# Patient Record
Sex: Male | Born: 1940 | Race: White | Hispanic: No | Marital: Married | Smoking: Former smoker
Health system: Southern US, Community
[De-identification: ages and names within clinical notes are randomized; demographics above are authoritative.]

## PROBLEM LIST (undated history)

## (undated) DIAGNOSIS — I350 Nonrheumatic aortic (valve) stenosis: Secondary | ICD-10-CM

## (undated) DIAGNOSIS — E785 Hyperlipidemia, unspecified: Secondary | ICD-10-CM

## (undated) DIAGNOSIS — I8392 Asymptomatic varicose veins of left lower extremity: Secondary | ICD-10-CM

## (undated) DIAGNOSIS — R011 Cardiac murmur, unspecified: Secondary | ICD-10-CM

## (undated) DIAGNOSIS — H9319 Tinnitus, unspecified ear: Secondary | ICD-10-CM

## (undated) DIAGNOSIS — K649 Unspecified hemorrhoids: Secondary | ICD-10-CM

## (undated) DIAGNOSIS — R7983 Abnormal findings of blood amino-acid level: Secondary | ICD-10-CM

## (undated) DIAGNOSIS — D126 Benign neoplasm of colon, unspecified: Secondary | ICD-10-CM

## (undated) DIAGNOSIS — J309 Allergic rhinitis, unspecified: Secondary | ICD-10-CM

## (undated) DIAGNOSIS — L509 Urticaria, unspecified: Secondary | ICD-10-CM

## (undated) DIAGNOSIS — M353 Polymyalgia rheumatica: Secondary | ICD-10-CM

## (undated) DIAGNOSIS — L570 Actinic keratosis: Secondary | ICD-10-CM

## (undated) DIAGNOSIS — H9190 Unspecified hearing loss, unspecified ear: Secondary | ICD-10-CM

## (undated) DIAGNOSIS — E7211 Homocystinuria: Secondary | ICD-10-CM

## (undated) DIAGNOSIS — K219 Gastro-esophageal reflux disease without esophagitis: Secondary | ICD-10-CM

## (undated) DIAGNOSIS — G2581 Restless legs syndrome: Secondary | ICD-10-CM

## (undated) DIAGNOSIS — I2510 Atherosclerotic heart disease of native coronary artery without angina pectoris: Secondary | ICD-10-CM

## (undated) HISTORY — PX: CARDIAC CATHETERIZATION: SHX172

## (undated) HISTORY — DX: Hyperlipidemia, unspecified: E78.5

## (undated) HISTORY — DX: Atherosclerotic heart disease of native coronary artery without angina pectoris: I25.10

---
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
## 1973-01-26 HISTORY — PX: VASECTOMY: SHX75

## 1997-06-27 ENCOUNTER — Ambulatory Visit (HOSPITAL_COMMUNITY): Admission: RE | Admit: 1997-06-27 | Discharge: 1997-06-27 | Payer: Self-pay | Admitting: Urology

## 1997-10-29 ENCOUNTER — Ambulatory Visit (HOSPITAL_COMMUNITY): Admission: RE | Admit: 1997-10-29 | Discharge: 1997-10-29 | Payer: Self-pay | Admitting: *Deleted

## 2001-06-10 ENCOUNTER — Emergency Department (HOSPITAL_COMMUNITY): Admission: EM | Admit: 2001-06-10 | Discharge: 2001-06-11 | Payer: Self-pay | Admitting: *Deleted

## 2001-10-10 ENCOUNTER — Encounter (INDEPENDENT_AMBULATORY_CARE_PROVIDER_SITE_OTHER): Payer: Self-pay

## 2001-10-10 ENCOUNTER — Ambulatory Visit (HOSPITAL_COMMUNITY): Admission: RE | Admit: 2001-10-10 | Discharge: 2001-10-10 | Payer: Self-pay | Admitting: Internal Medicine

## 2002-11-29 ENCOUNTER — Ambulatory Visit (HOSPITAL_COMMUNITY): Admission: RE | Admit: 2002-11-29 | Discharge: 2002-11-29 | Payer: Self-pay | Admitting: *Deleted

## 2003-06-06 ENCOUNTER — Emergency Department (HOSPITAL_COMMUNITY): Admission: EM | Admit: 2003-06-06 | Discharge: 2003-06-07 | Payer: Self-pay | Admitting: Emergency Medicine

## 2008-06-19 ENCOUNTER — Ambulatory Visit (HOSPITAL_COMMUNITY): Admission: RE | Admit: 2008-06-19 | Discharge: 2008-06-19 | Payer: Self-pay | Admitting: *Deleted

## 2009-01-26 HISTORY — PX: NM MYOCAR PERF WALL MOTION: HXRAD629

## 2009-01-26 HISTORY — PX: TRANSTHORACIC ECHOCARDIOGRAM: SHX275

## 2010-06-10 NOTE — Op Note (Signed)
NAME:  Evan Myers, WELTER NO.:  [id number]   MEDICAL RECORD NO.:  [id number]          PATIENT TYPE:  AMB   LOCATION:  ENDO                         FACILITY:  [facility]   PHYSICIAN:  Georgiana Spinner, M.D.    DATE OF BIRTH:  1940/02/15   DATE OF PROCEDURE:  06/19/2008  DATE OF DISCHARGE:                               OPERATIVE REPORT   PROCEDURE:  Colonoscopy.   INDICATIONS:  Colon polyps.   ANESTHESIA:  Fentanyl 62 mcg, Versed 5 mg.   PROCEDURE:  With the patient mildly sedated in the left lateral  decubitus position, rectal exam was performed which was unremarkable to  my exam.  Subsequently the Pentax videoscopic colonoscope was inserted  into the rectum and passed under direct vision to the cecum identified  by ileocecal valve and appendiceal orifice, both of which were  photographed.  From this point the colonoscope was slowly withdrawn  taking circumferential views of colonic mucosa, stopping only in the  rectum which appeared normal on direct, showed hemorrhoids on  retroflexed view.  The endoscope was straightened and withdrawn.  The  patient's vital signs and pulse oximeter remained stable.  The patient  tolerated the procedure well without apparent complications.   FINDINGS:  Occasional diverticulum of the sigmoid colon.  Internal  hemorrhoids; otherwise unremarkable exam.   PLAN:  Consider repeat examination in 5 years.           ______________________________  Georgiana Spinner, M.D.     GMO/MEDQ  D:  06/19/2008  T:  06/19/2008  Job:  [id number]

## 2010-06-13 NOTE — Op Note (Signed)
   NAME:  Evan Myers, Evan Myers                   ACCOUNT NO.:  [id number]   MEDICAL RECORD NO.:  [id number]                   PATIENT TYPE:  AMB   LOCATION:  ENDO                                 FACILITY:  [facility]   PHYSICIAN:  Georgiana Spinner, M.D.                 DATE OF BIRTH:  October 06, 1940   DATE OF PROCEDURE:  10/10/2001  DATE OF DISCHARGE:                                 OPERATIVE REPORT   PROCEDURE:  Endoscopy.   INDICATIONS:  Gastroesophageal reflux disease, question of Barrett's.   ANESTHESIA:  Demerol 50, Versed 4 mg.   DESCRIPTION OF PROCEDURE:  With patient mildly sedated in the left lateral  decubitus position, the Olympus videoscopic endoscope was inserted in the  mouth and passed under direct vision through the esophagus, which appeared  normal except for the distal esophagus, and there was a question of an area  of Barrett's, photographed and biopsied.  We entered into the stomach.  Fundus, body, antrum, duodenal bulb, and second portion of duodenum appeared  normal.  From this point, the endoscope was slowly withdrawn, taking  circumferential views of the entire duodenal mucosa until the then endoscope  pulled back into the stomach, placed in retroflexion to view the stomach  from below.  The endoscope was then straightened and withdrawn, taking  circumferential views of the remaining gastric and esophageal mucosa.  The  patient's vital signs and pulse oximeter remained stable.  The patient  tolerated the procedure well without apparent complications.   FINDINGS:  Question of Barrett's esophagus, biopsied.  Await biopsy report.  The patient will call me for results and follow up with me as an outpatient.                                               Georgiana Spinner, M.D.    GMO/MEDQ  D:  10/10/2001  T:  10/10/2001  Job:  [id number]

## 2010-06-13 NOTE — Op Note (Signed)
   NAME:  Evan Myers, Evan Myers                   ACCOUNT NO.:  [id number]   MEDICAL RECORD NO.:  [id number]                   PATIENT TYPE:  AMB   LOCATION:  ENDO                                 FACILITY:  [facility]   PHYSICIAN:  Georgiana Spinner, M.D.                 DATE OF BIRTH:  03-26-1940   DATE OF PROCEDURE:  11/29/2002  DATE OF DISCHARGE:                                 OPERATIVE REPORT   PROCEDURE:  Colonoscopy.   INDICATIONS:  Rectal bleeding.   ANESTHESIA:  Demerol 70 mg, Versed 7 mg.   DESCRIPTION OF PROCEDURE:  With the patient mildly sedated in the left  lateral decubitus position, the Olympus videoscopic colonoscope was inserted  in the rectum and passed under direct vision to the cecum, identified by the  crow's foot of the cecum, the ileocecal valve and appendiceal orifice, all  of which were photographed.  From this point the colonoscope was slowly  withdrawn, taking circumferential views of the colonic mucosa, stopping only  in the rectum, which appeared normal on direct and showed hemorrhoids on  retroflexed view.  The endoscope was straightened and withdrawn.  The  patient's vital signs and pulse oximetry remained stable.  The patient  tolerated the procedure well without apparent complications.   FINDINGS:  Internal hemorrhoids, otherwise unremarkable colonoscopic  examination to the cecum.   PLAN:  Repeat examination in approximately five years.                                               Georgiana Spinner, M.D.    GMO/MEDQ  D:  11/29/2002  T:  11/29/2002  Job:  [id number]

## 2013-09-08 ENCOUNTER — Encounter: Payer: Self-pay | Admitting: *Deleted

## 2013-09-14 ENCOUNTER — Ambulatory Visit (INDEPENDENT_AMBULATORY_CARE_PROVIDER_SITE_OTHER): Payer: Medicare Other | Admitting: Cardiovascular Disease

## 2013-09-14 VITALS — BP 104/64 | HR 60 | Resp 16 | Ht 70.0 in | Wt 206.7 lb

## 2013-09-14 DIAGNOSIS — I359 Nonrheumatic aortic valve disorder, unspecified: Secondary | ICD-10-CM

## 2013-09-14 DIAGNOSIS — I350 Nonrheumatic aortic (valve) stenosis: Secondary | ICD-10-CM

## 2013-09-14 DIAGNOSIS — E785 Hyperlipidemia, unspecified: Secondary | ICD-10-CM

## 2013-09-14 DIAGNOSIS — R011 Cardiac murmur, unspecified: Secondary | ICD-10-CM

## 2013-09-14 NOTE — Patient Instructions (Signed)
Your physician has requested that you have an echocardiogram. Echocardiography is a painless test that uses sound waves to create images of your heart. It provides your doctor with information about the size and shape of your heart and how well your heart's chambers and valves are working. This procedure takes approximately one hour. There are no restrictions for this procedure.  Dr. Croitoru recommends that you schedule a follow-up appointment in: One year.   

## 2013-09-16 ENCOUNTER — Encounter: Payer: Self-pay | Admitting: Cardiovascular Disease

## 2013-09-16 DIAGNOSIS — I350 Nonrheumatic aortic (valve) stenosis: Secondary | ICD-10-CM | POA: Insufficient documentation

## 2013-09-16 DIAGNOSIS — R011 Cardiac murmur, unspecified: Secondary | ICD-10-CM | POA: Insufficient documentation

## 2013-09-16 DIAGNOSIS — E785 Hyperlipidemia, unspecified: Secondary | ICD-10-CM | POA: Insufficient documentation

## 2013-09-16 NOTE — Assessment & Plan Note (Signed)
His physical exam is compatible with aortic stenosis. The findings are consistent with more than mild valvular stenosis, but his symptoms to suggest that he does not have severe or critical aortic stenosis. It is reasonable to reevaluate the aortic valve since it has been normal as 5 years since his last echo. He should remain physically active and report emergence of exertional symptoms.

## 2013-09-16 NOTE — Progress Notes (Signed)
Patient ID: Evan Myers, male   DOB: Jun 10, 1940, 73 y.o.   MRN: [id number]      Reason for office visit Cardiac murmur  Mr. Frisk is a 73 year old gentleman with history of hyperlipidemia referred for a cardiac murmur. He has been diagnosed with mild obstructive sleep apnea does require CPAP therapy and has gastroesophageal reflux disease and a previous history of polymyalgia rheumatica. He is very active and looks substantially younger than his chronological age. He denies cardiovascular complaints and specifically denies exertional angina, dyspnea or syncope. He walks briskly 40 minutes a day 5 days a week.  His physical exam reveals an early peaking systolic ejection murmur heard best in the aortic focus and at the left lower sternal border that does not really radiate towards the carotids or the apex. He had an echocardiogram performed in April 2011 that showed normal left ventricular size systolic and diastolic function and mild valvular aortic stenosis (calculated aortic valve area was not reported, peak gradient 23 mm Hg, mean gradient 14 mm Hg). His electrocardiogram shows borderline criteria for LVH by voltage only without any repolarization abnormalities.   No Known Allergies  Current Outpatient Prescriptions  Medication Sig Dispense Refill  . alfuzosin (UROXATRAL) 10 MG 24 hr tablet Take 10 mg by mouth daily.       Marland Kitchen aspirin 81 MG tablet Take 81 mg by mouth daily.      Marland Kitchen atorvastatin (LIPITOR) 10 MG tablet Take 5 mg by mouth daily.      . cetirizine (ZYRTEC) 10 MG tablet Take 10 mg by mouth daily.      . fluticasone (FLONASE) 50 MCG/ACT nasal spray Place into both nostrils daily as needed for allergies or rhinitis.      . Multiple Vitamins-Minerals (CENTRUM SILVER PO) Take by mouth daily.      Marland Kitchen omeprazole (PRILOSEC) 40 MG capsule Take 40 mg by mouth daily.       No current facility-administered medications for this visit.    Past Medical History  Diagnosis Date  .  Hyperlipidemia   . Mild CAD     via cath in 2008 (per office notes)    Past Surgical History  Procedure Laterality Date  . Tonsillectomy  1949  . Vasectomy  1975  . Transthoracic echocardiogram  2011    EF=>55%; mild mitral annular calcification, mild MR; mild TR, normal RVSP; mild calcification of AV leaflets and mild valvular AS; aortic root sclerosis/calcification  . Nm myocar perf wall motion  2011    bruce myoview - normal perfusion, EF 58%    Family History  Problem Relation Age of Onset  . Hypertension Mother   . Hyperlipidemia Mother   . Heart failure Mother   . Skin cancer Mother   . Heart attack Father 11  . Hypertension Father   . COPD Father   . Emphysema Father   . Stroke Father   . Hypotension Maternal Grandmother   . Diabetes Paternal Grandmother   . Nephrolithiasis Brother     half - sibling  . Hypertension Brother     half - sibling  . Hyperlipidemia Brother     half - sibling  . Hypertension Sister   . SIDS Brother     9 months, half - sibling    History   Social History  . Marital Status: Married    Spouse Name: N/A    Number of Children: 2  . Years of Education: bachelor's   Occupational History  .  electronics/engineering    Social History Main Topics  . Smoking status: Former Smoker -- 1.00 packs/day for 6 years    Types: Cigarettes    Quit date: 09/08/1968  . Smokeless tobacco: Not on file  . Alcohol Use: Not on file  . Drug Use: Not on file  . Sexual Activity: Not on file   Other Topics Concern  . Not on file   Social History Narrative  . No narrative on file    Review of systems: The patient specifically denies any chest pain at rest or with exertion, dyspnea at rest or with exertion, orthopnea, paroxysmal nocturnal dyspnea, syncope, palpitations, focal neurological deficits, intermittent claudication, lower extremity edema, unexplained weight gain, cough, hemoptysis or wheezing.  The patient also denies abdominal pain,  nausea, vomiting, dysphagia, diarrhea, constipation, polyuria, polydipsia, dysuria, hematuria, frequency, urgency, abnormal bleeding or bruising, fever, chills, unexpected weight changes, mood swings, change in skin or hair texture, change in voice quality, auditory or visual problems, allergic reactions or rashes, new musculoskeletal complaints other than usual "aches and pains".   PHYSICAL EXAM BP 104/64  Pulse 60  Resp 16  Ht 5\' 10"  (1.778 m)  Wt 206 lb 11.2 oz (93.759 kg)  BMI 29.66 kg/m2  General: Alert, oriented x3, no distress Head: no evidence of trauma, PERRL, EOMI, no exophtalmos or lid lag, no myxedema, no xanthelasma; normal ears, nose and oropharynx Neck: normal jugular venous pulsations and no hepatojugular reflux; brisk carotid pulses without delay and no carotid bruits Chest: clear to auscultation, no signs of consolidation by percussion or palpation, normal fremitus, symmetrical and full respiratory excursions Cardiovascular: normal position and quality of the apical impulse, regular rhythm, normal first and second heart sounds, grade 2-3/6 early to mid peaking systolic ejection murmur heard in the aortic focus and left lower sternal border, no diastolic murmurs, rubs or gallops Abdomen: no tenderness or distention, no masses by palpation, no abnormal pulsatility or arterial bruits, normal bowel sounds, no hepatosplenomegaly Extremities: no clubbing, cyanosis or edema; 2+ radial, ulnar and brachial pulses bilaterally; 2+ right femoral, posterior tibial and dorsalis pedis pulses; 2+ left femoral, posterior tibial and dorsalis pedis pulses; no subclavian or femoral bruits Neurological: grossly nonfocal   EKG: Sinus rhythm, voltage criteria for LVH otherwise normal  Lipid Panel  Total cholesterol 154, triglycerides 46, HDL 47, LDL 98   ASSESSMENT AND PLAN Aortic valve stenosis His physical exam is compatible with aortic stenosis. The findings are consistent with more than  mild valvular stenosis, but his symptoms suggest that he does not have severe or critical aortic stenosis. It is reasonable to reevaluate the aortic valve by echo, since it has been almost 5 years since his last echo. He should remain physically active and report emergence of exertional symptoms.  Hyperlipidemia Parameters are in the desirable range   Orders Placed This Encounter  Procedures  . EKG 12-Lead  . 2D Echocardiogram without contrast   Meds ordered this encounter  Medications  . atorvastatin (LIPITOR) 10 MG tablet    Sig: Take 5 mg by mouth daily.  Marland Kitchen omeprazole (PRILOSEC) 40 MG capsule    Sig: Take 40 mg by mouth daily.    Holli Humbles, MD, [place] [phone] office [phone] pager

## 2013-09-16 NOTE — Assessment & Plan Note (Signed)
Parameters are in the desirable range

## 2013-09-20 ENCOUNTER — Ambulatory Visit (HOSPITAL_COMMUNITY)
Admission: RE | Admit: 2013-09-20 | Discharge: 2013-09-20 | Disposition: A | Discharge status: Home / Self Care | Payer: Medicare Other | Source: Ambulatory Visit | Attending: Cardiovascular Disease | Admitting: Cardiovascular Disease

## 2013-09-20 DIAGNOSIS — R011 Cardiac murmur, unspecified: Secondary | ICD-10-CM | POA: Diagnosis not present

## 2013-09-20 DIAGNOSIS — I359 Nonrheumatic aortic valve disorder, unspecified: Secondary | ICD-10-CM | POA: Diagnosis present

## 2013-09-20 DIAGNOSIS — G4730 Sleep apnea, unspecified: Secondary | ICD-10-CM | POA: Insufficient documentation

## 2013-09-20 DIAGNOSIS — I350 Nonrheumatic aortic (valve) stenosis: Secondary | ICD-10-CM

## 2013-09-20 DIAGNOSIS — I059 Rheumatic mitral valve disease, unspecified: Secondary | ICD-10-CM

## 2013-09-20 DIAGNOSIS — E785 Hyperlipidemia, unspecified: Secondary | ICD-10-CM | POA: Insufficient documentation

## 2013-09-20 NOTE — Progress Notes (Signed)
2D Echo Performed 09/20/2013    Markiesha Delia, RCS  

## 2014-05-07 DIAGNOSIS — H2513 Age-related nuclear cataract, bilateral: Secondary | ICD-10-CM | POA: Diagnosis not present

## 2014-05-07 DIAGNOSIS — H40003 Preglaucoma, unspecified, bilateral: Secondary | ICD-10-CM | POA: Diagnosis not present

## 2014-05-15 DIAGNOSIS — Z7952 Long term (current) use of systemic steroids: Secondary | ICD-10-CM | POA: Diagnosis not present

## 2014-05-15 DIAGNOSIS — M353 Polymyalgia rheumatica: Secondary | ICD-10-CM | POA: Diagnosis not present

## 2014-08-17 DIAGNOSIS — E780 Pure hypercholesterolemia: Secondary | ICD-10-CM | POA: Diagnosis not present

## 2014-08-23 DIAGNOSIS — Z0000 Encounter for general adult medical examination without abnormal findings: Secondary | ICD-10-CM | POA: Diagnosis not present

## 2014-08-23 DIAGNOSIS — Z1212 Encounter for screening for malignant neoplasm of rectum: Secondary | ICD-10-CM | POA: Diagnosis not present

## 2014-08-23 DIAGNOSIS — B079 Viral wart, unspecified: Secondary | ICD-10-CM | POA: Diagnosis not present

## 2014-08-23 DIAGNOSIS — M791 Myalgia: Secondary | ICD-10-CM | POA: Diagnosis not present

## 2014-08-23 DIAGNOSIS — Z8739 Personal history of other diseases of the musculoskeletal system and connective tissue: Secondary | ICD-10-CM | POA: Diagnosis not present

## 2014-08-27 DIAGNOSIS — M791 Myalgia: Secondary | ICD-10-CM | POA: Diagnosis not present

## 2014-10-10 DIAGNOSIS — E039 Hypothyroidism, unspecified: Secondary | ICD-10-CM | POA: Diagnosis not present

## 2014-10-11 DIAGNOSIS — E039 Hypothyroidism, unspecified: Secondary | ICD-10-CM | POA: Diagnosis not present

## 2014-10-11 DIAGNOSIS — M791 Myalgia: Secondary | ICD-10-CM | POA: Diagnosis not present

## 2014-10-11 DIAGNOSIS — Z23 Encounter for immunization: Secondary | ICD-10-CM | POA: Diagnosis not present

## 2015-04-29 DIAGNOSIS — H01003 Unspecified blepharitis right eye, unspecified eyelid: Secondary | ICD-10-CM | POA: Diagnosis not present

## 2015-04-29 DIAGNOSIS — H2513 Age-related nuclear cataract, bilateral: Secondary | ICD-10-CM | POA: Diagnosis not present

## 2015-04-29 DIAGNOSIS — H25013 Cortical age-related cataract, bilateral: Secondary | ICD-10-CM | POA: Diagnosis not present

## 2015-04-29 DIAGNOSIS — H35033 Hypertensive retinopathy, bilateral: Secondary | ICD-10-CM | POA: Diagnosis not present

## 2015-05-06 DIAGNOSIS — H2513 Age-related nuclear cataract, bilateral: Secondary | ICD-10-CM | POA: Diagnosis not present

## 2015-05-06 DIAGNOSIS — H40003 Preglaucoma, unspecified, bilateral: Secondary | ICD-10-CM | POA: Diagnosis not present

## 2015-05-06 DIAGNOSIS — M353 Polymyalgia rheumatica: Secondary | ICD-10-CM | POA: Diagnosis not present

## 2015-08-23 DIAGNOSIS — E039 Hypothyroidism, unspecified: Secondary | ICD-10-CM | POA: Diagnosis not present

## 2015-08-23 DIAGNOSIS — E7800 Pure hypercholesterolemia, unspecified: Secondary | ICD-10-CM | POA: Diagnosis not present

## 2015-08-23 DIAGNOSIS — Z125 Encounter for screening for malignant neoplasm of prostate: Secondary | ICD-10-CM | POA: Diagnosis not present

## 2015-08-23 DIAGNOSIS — Z0000 Encounter for general adult medical examination without abnormal findings: Secondary | ICD-10-CM | POA: Diagnosis not present

## 2015-08-23 DIAGNOSIS — Z0001 Encounter for general adult medical examination with abnormal findings: Secondary | ICD-10-CM | POA: Diagnosis not present

## 2015-08-30 DIAGNOSIS — Z0000 Encounter for general adult medical examination without abnormal findings: Secondary | ICD-10-CM | POA: Diagnosis not present

## 2015-08-30 DIAGNOSIS — E039 Hypothyroidism, unspecified: Secondary | ICD-10-CM | POA: Diagnosis not present

## 2015-08-30 DIAGNOSIS — E7800 Pure hypercholesterolemia, unspecified: Secondary | ICD-10-CM | POA: Diagnosis not present

## 2015-11-27 DIAGNOSIS — E7800 Pure hypercholesterolemia, unspecified: Secondary | ICD-10-CM | POA: Diagnosis not present

## 2015-12-02 DIAGNOSIS — K219 Gastro-esophageal reflux disease without esophagitis: Secondary | ICD-10-CM | POA: Diagnosis not present

## 2015-12-02 DIAGNOSIS — M25569 Pain in unspecified knee: Secondary | ICD-10-CM | POA: Diagnosis not present

## 2015-12-02 DIAGNOSIS — E7800 Pure hypercholesterolemia, unspecified: Secondary | ICD-10-CM | POA: Diagnosis not present

## 2015-12-02 DIAGNOSIS — Z23 Encounter for immunization: Secondary | ICD-10-CM | POA: Diagnosis not present

## 2015-12-28 ENCOUNTER — Encounter (HOSPITAL_COMMUNITY): Payer: Self-pay | Admitting: Oncology

## 2015-12-28 ENCOUNTER — Emergency Department (HOSPITAL_COMMUNITY)
Admission: EM | Admit: 2015-12-28 | Discharge: 2015-12-29 | Disposition: A | Discharge status: Home / Self Care | Payer: Medicare Other | Attending: Emergency Medicine | Admitting: Emergency Medicine

## 2015-12-28 ENCOUNTER — Emergency Department (HOSPITAL_COMMUNITY): Payer: Medicare Other

## 2015-12-28 DIAGNOSIS — Z87891 Personal history of nicotine dependence: Secondary | ICD-10-CM | POA: Diagnosis not present

## 2015-12-28 DIAGNOSIS — R590 Localized enlarged lymph nodes: Secondary | ICD-10-CM | POA: Diagnosis not present

## 2015-12-28 DIAGNOSIS — Z7982 Long term (current) use of aspirin: Secondary | ICD-10-CM | POA: Diagnosis not present

## 2015-12-28 DIAGNOSIS — Z79899 Other long term (current) drug therapy: Secondary | ICD-10-CM | POA: Diagnosis not present

## 2015-12-28 DIAGNOSIS — J029 Acute pharyngitis, unspecified: Secondary | ICD-10-CM | POA: Diagnosis not present

## 2015-12-28 DIAGNOSIS — I2510 Atherosclerotic heart disease of native coronary artery without angina pectoris: Secondary | ICD-10-CM | POA: Diagnosis not present

## 2015-12-28 DIAGNOSIS — R509 Fever, unspecified: Secondary | ICD-10-CM | POA: Diagnosis present

## 2015-12-28 HISTORY — DX: Asymptomatic varicose veins of left lower extremity: I83.92

## 2015-12-28 HISTORY — DX: Nonrheumatic aortic (valve) stenosis: I35.0

## 2015-12-28 HISTORY — DX: Abnormal findings of blood amino-acid level: R79.83

## 2015-12-28 HISTORY — DX: Polymyalgia rheumatica: M35.3

## 2015-12-28 HISTORY — DX: Tinnitus, unspecified ear: H93.19

## 2015-12-28 HISTORY — DX: Homocystinuria: E72.11

## 2015-12-28 HISTORY — DX: Actinic keratosis: L57.0

## 2015-12-28 HISTORY — DX: Gastro-esophageal reflux disease without esophagitis: K21.9

## 2015-12-28 HISTORY — DX: Benign neoplasm of colon, unspecified: D12.6

## 2015-12-28 HISTORY — DX: Unspecified hearing loss, unspecified ear: H91.90

## 2015-12-28 HISTORY — DX: Restless legs syndrome: G25.81

## 2015-12-28 HISTORY — DX: Allergic rhinitis, unspecified: J30.9

## 2015-12-28 HISTORY — DX: Urticaria, unspecified: L50.9

## 2015-12-28 HISTORY — DX: Cardiac murmur, unspecified: R01.1

## 2015-12-28 HISTORY — DX: Unspecified hemorrhoids: K64.9

## 2015-12-28 LAB — CBC WITH DIFFERENTIAL/PLATELET
BASOS PCT: 0 %
Basophils Absolute: 0 10*3/uL (ref 0.0–0.1)
Eosinophils Absolute: 0.2 10*3/uL (ref 0.0–0.7)
Eosinophils Relative: 2 %
HEMATOCRIT: 38.8 % — AB (ref 39.0–52.0)
HEMOGLOBIN: 13.8 g/dL (ref 13.0–17.0)
LYMPHS ABS: 1.3 10*3/uL (ref 0.7–4.0)
Lymphocytes Relative: 13 %
MCH: 31.6 pg (ref 26.0–34.0)
MCHC: 35.6 g/dL (ref 30.0–36.0)
MCV: 88.8 fL (ref 78.0–100.0)
MONOS PCT: 9 %
Monocytes Absolute: 0.9 10*3/uL (ref 0.1–1.0)
NEUTROS ABS: 7.7 10*3/uL (ref 1.7–7.7)
NEUTROS PCT: 76 %
Platelets: 226 10*3/uL (ref 150–400)
RBC: 4.37 MIL/uL (ref 4.22–5.81)
RDW: 13.4 % (ref 11.5–15.5)
WBC: 10.1 10*3/uL (ref 4.0–10.5)

## 2015-12-28 MED ORDER — MORPHINE SULFATE (PF) 4 MG/ML IV SOLN
4.0000 mg | Freq: Once | INTRAVENOUS | Status: AC
Start: 2015-12-28 — End: 2015-12-28
  Administered 2015-12-28: 4 mg via INTRAVENOUS
  Filled 2015-12-28: qty 1

## 2015-12-28 MED ORDER — SODIUM CHLORIDE 0.9 % IV BOLUS (SEPSIS)
1000.0000 mL | Freq: Once | INTRAVENOUS | Status: AC
  Administered 2015-12-28: 1000 mL via INTRAVENOUS

## 2015-12-28 NOTE — ED Provider Notes (Signed)
[facility] DEPT Provider Note   CSN: [id number] Arrival date & time: 12/28/15  2248  By signing my name below, I, Arianna Nassar, attest that this documentation has been prepared under the direction and in the presence of Jola Schmidt, MD.  Electronically Signed: Julien Nordmann, ED Scribe. 12/28/15. 11:23 PM.    History   Chief Complaint Chief Complaint  Patient presents with  . Flu Like Sx    The history is provided by the patient. No language interpreter was used.   HPI Comments: Evan Myers is a 75 y.o. male who has a PMhx of murmur, HLD and aortic valve stenosis presents to the Emergency Department complaining of gradual worsening, moderate flu like symptoms x 1 week. He reports associcated post nasal drip, mild cough, loss of appetite, nasal congestion, fever (TMax 100.4) fatigue and generalized myalgias. Pt expresses that his sore throat began as bilateral but gradually radiated to the left side of his neck. He further states that around 2:30 this afternoon, he noticed his left lymph node was swollen and tender to touch. He says his sore throat is worsened when opening his mouth. Pt received his flu shot this season. He denies shortness of breath or ear pain.   PCP: Horatio Pel, MD  Past Medical History:  Diagnosis Date  . Hyperlipidemia   . Mild CAD    via cath in 2008 (per office notes)    Patient Active Problem List   Diagnosis Date Noted  . Cardiac murmur 09/16/2013  . Aortic valve stenosis 09/16/2013  . Hyperlipidemia 09/16/2013    Past Surgical History:  Procedure Laterality Date  . NM MYOCAR PERF WALL MOTION  2011   bruce myoview - normal perfusion, EF 58%  . TONSILLECTOMY  1949  . TRANSTHORACIC ECHOCARDIOGRAM  2011   EF=>55%; mild mitral annular calcification, mild MR; mild TR, normal RVSP; mild calcification of AV leaflets and mild valvular AS; aortic root sclerosis/calcification  . VASECTOMY  1975       Home Medications    Prior  to Admission medications   Medication Sig Start Date End Date Taking? Authorizing Provider  alfuzosin (UROXATRAL) 10 MG 24 hr tablet Take 10 mg by mouth daily.     Historical Provider, MD  aspirin 81 MG tablet Take 81 mg by mouth daily.    Historical Provider, MD  atorvastatin (LIPITOR) 10 MG tablet Take 5 mg by mouth daily.    Historical Provider, MD  cetirizine (ZYRTEC) 10 MG tablet Take 10 mg by mouth daily.    Historical Provider, MD  fluticasone (FLONASE) 50 MCG/ACT nasal spray Place into both nostrils daily as needed for allergies or rhinitis.    Historical Provider, MD  Multiple Vitamins-Minerals (CENTRUM SILVER PO) Take by mouth daily.    Historical Provider, MD  omeprazole (PRILOSEC) 40 MG capsule Take 40 mg by mouth daily.    Historical Provider, MD    Family History Family History  Problem Relation Age of Onset  . Hypertension Mother   . Hyperlipidemia Mother   . Heart failure Mother   . Skin cancer Mother   . Heart attack Father 88  . Hypertension Father   . COPD Father   . Emphysema Father   . Stroke Father   . Hypotension Maternal Grandmother   . Diabetes Paternal Grandmother   . Nephrolithiasis Brother     half - sibling  . Hypertension Brother     half - sibling  . Hyperlipidemia Brother     half -  sibling  . Hypertension Sister   . SIDS Brother     9 months, half - sibling    Social History Social History  Substance Use Topics  . Smoking status: Former Smoker    Packs/day: 1.00    Years: 6.00    Types: Cigarettes    Quit date: 09/08/1968  . Smokeless tobacco: Not on file  . Alcohol use Not on file     Allergies   Patient has no known allergies.   Review of Systems Review of Systems  A complete 10 system review of systems was obtained and all systems are negative except as noted in the HPI and PMH.    Physical Exam Updated Vital Signs BP 155/76 (BP Location: Left Arm)   Pulse 93   Temp 99.2 F (37.3 C) (Oral)   Resp 20   Ht 5' 9.5"  (1.765 m)   Wt 205 lb (93 kg)   SpO2 94%   BMI 29.84 kg/m   Physical Exam  Constitutional: He is oriented to person, place, and time. He appears well-developed and well-nourished.  HENT:  Head: Normocephalic and atraumatic.  Bilateral TMs are normal, posterior pharynx with mild erythema, uvula midline, mild fullness of left tonsillar pillar  Eyes: EOM are normal.  Neck: Normal range of motion.  Tender lymphadenopathy left neck  Cardiovascular: Normal rate, regular rhythm and intact distal pulses.   Murmur (systolic) heard. Pulmonary/Chest: Effort normal and breath sounds normal. No respiratory distress.  Abdominal: Soft. He exhibits no distension. There is no tenderness.  Musculoskeletal: Normal range of motion.  Neurological: He is alert and oriented to person, place, and time.  Skin: Skin is warm and dry.  Psychiatric: He has a normal mood and affect. Judgment normal.  Nursing note and vitals reviewed.    ED Treatments / Results  DIAGNOSTIC STUDIES: Oxygen Saturation is 94% on RA, normal by my interpretation.  COORDINATION OF CARE:  11:20 PM Will order a CT of neck and pain medication. Discussed treatment plan with pt at bedside and pt agreed to plan.  Labs (all labs ordered are listed, but only abnormal results are displayed) Labs Reviewed  CBC WITH DIFFERENTIAL/PLATELET - Abnormal; Notable for the following:       Result Value   HCT 38.8 (*)    All other components within normal limits  BASIC METABOLIC PANEL - Abnormal; Notable for the following:    Glucose, Bld 118 (*)    GFR calc non Af Amer 56 (*)    All other components within normal limits  MONONUCLEOSIS SCREEN    EKG  EKG Interpretation None       Radiology Ct Soft Tissue Neck W Contrast  Result Date: 12/29/2015 CLINICAL DATA:  Initial evaluation for left sided neck pain with sore throat. EXAM: CT NECK WITH CONTRAST TECHNIQUE: Multidetector CT imaging of the neck was performed using the standard  protocol following the bolus administration of intravenous contrast. CONTRAST:  5mL ISOVUE-300 IOPAMIDOL (ISOVUE-300) INJECTION 61% COMPARISON:  None available. FINDINGS: Pharynx and larynx: Oral cavity within normal limits without evidence for mass lesion or loculated fluid collection. No acute abnormality about the dentition. Palatine tonsils appear to be absent. Lingual tonsils are mildly prominent and hypertrophied, filling the vallecula. Superimposed tonsillith noted on the left. 6 mm hypodensity within the right lingual tonsil suspected to reflect a small micro abscess (series 4, image 49). Adjacent epiglottis without overt edema. Nasopharynx within normal limits. Retropharyngeal soft tissues within normal limits. Mild mucosal edema within  knee left pharynx/hypopharynx, suspicious for acute pharyngitis. Piriform sinuses are clear. True cords symmetric and normal. Subglottic airway clear. Salivary glands: Parotid glands are normal. Inflammatory fat stranding present within the left submandibular space, extending posteriorly into the left parapharyngeal space, and posteriorly towards the left carotid space. The left submandibular gland itself felt to be within normal limits without evidence for acute sialoadenitis. Right submandibular gland normal. No abscess or drainable fluid collection. Thyroid: Normal. Lymph nodes: Mildly enlarged left level IIa lymph node measures up to 13 mm in short axis. Additional shotty sub cm left level 2/3 nodes are mildly prominent as compared to the right. These are suspected to be reactive in nature. No pathologically enlarged right-sided cervical lymph nodes identified. Vascular: Normal intravascular enhancement seen throughout the neck. Vascular calcifications noted about the right carotid bifurcation. Vascular calcifications also noted within the aortic arch. Limited intracranial: Negative. Visualized orbits: Partially visualized globes and orbits within normal limits.  Mastoids and visualized paranasal sinuses: Moderate mucosal thickening within the ethmoidal air cells and left maxillary sinus. Visualize mastoids are clear. Middle ear cavities are clear. Skeleton: No acute osseous abnormality. No worrisome lytic or blastic osseous lesions. Upper chest: Visualized mediastinum within normal limits. Atelectatic changes noted dependently within the visualized lungs. Visualized lungs are otherwise clear. IMPRESSION: 1. Inflammatory fat stranding within the left submandibular and parapharyngeal spaces within the left neck, with mildly prominent left-sided cervical adenopathy as above. Findings suspected to reflect acute lymphadenitis, likely reactive/infectious in nature. Clinical followup to resolution recommended. 2. Subtle asymmetric mucosal edema within the left pharynx, suggestive of associated pharyngitis. Oropharyngeal airway remains widely patent. 3. 6 mm hypodensity within the right aspect of the lingual tonsils, suggestive of a small micro abscess. Lingual tonsils themselves are somewhat enlarged and hypertrophied, likely reactive in nature. No other abscess or drainable fluid collection. Electronically Signed   By: Jeannine Boga M.D.   On: 12/29/2015 02:05    Procedures Procedures (including critical care time)  Medications Ordered in ED Medications - No data to display   Initial Impression / Assessment and Plan / ED Course  I have reviewed the triage vital signs and the nursing notes.  Pertinent labs & imaging results that were available during my care of the patient were reviewed by me and considered in my medical decision making (see chart for details).  Clinical Course     Reactive left-sided cervical adenopathy from likely developing bacterial infection of his pharynx.  Uvula is midline.  No obvious peritonsillar abscess noted clinically and nothing seen on CT imaging.  Patient will be started on clindamycin.  Outpatient primary care and ENT  follow-up.  He understands to return to the ER for new or worsening symptoms.  I personally performed the services described in this documentation, which was scribed in my presence. The recorded information has been reviewed and is accurate.     Final Clinical Impressions(s) / ED Diagnoses   Final diagnoses:  Pharyngitis, unspecified etiology  Reactive cervical lymphadenopathy    New Prescriptions New Prescriptions   CLINDAMYCIN (CLEOCIN) 300 MG CAPSULE    Take 1 capsule (300 mg total) by mouth 3 (three) times daily.   HYDROCODONE-ACETAMINOPHEN (NORCO/VICODIN) 5-325 MG TABLET    Take 1 tablet by mouth every 4 (four) hours as needed for moderate pain.     Jola Schmidt, MD 12/29/15 [phone]

## 2015-12-28 NOTE — ED Triage Notes (Signed)
Pt has had flu like sx x 1 week.  Pt reports feeling weak and drained.  This evening he noticed the left side of his neck swelling.  Lymph node is hard and swollen on the left side.  Pt's voice is raspy.

## 2015-12-29 DIAGNOSIS — R590 Localized enlarged lymph nodes: Secondary | ICD-10-CM | POA: Diagnosis not present

## 2015-12-29 DIAGNOSIS — J029 Acute pharyngitis, unspecified: Secondary | ICD-10-CM | POA: Diagnosis not present

## 2015-12-29 LAB — BASIC METABOLIC PANEL
ANION GAP: 8 (ref 5–15)
BUN: 16 mg/dL (ref 6–20)
CHLORIDE: 107 mmol/L (ref 101–111)
CO2: 25 mmol/L (ref 22–32)
Calcium: 9 mg/dL (ref 8.9–10.3)
Creatinine, Ser: 1.23 mg/dL (ref 0.61–1.24)
GFR calc non Af Amer: 56 mL/min — ABNORMAL LOW (ref >60)
GLUCOSE: 118 mg/dL — AB (ref 65–99)
Potassium: 3.9 mmol/L (ref 3.5–5.1)
Sodium: 140 mmol/L (ref 135–145)

## 2015-12-29 LAB — MONONUCLEOSIS SCREEN: Mono Screen: NEGATIVE

## 2015-12-29 MED ORDER — KETOROLAC TROMETHAMINE 30 MG/ML IJ SOLN
30.0000 mg | Freq: Once | INTRAMUSCULAR | Status: AC
  Administered 2015-12-29: 30 mg via INTRAVENOUS
  Filled 2015-12-29: qty 1

## 2015-12-29 MED ORDER — HYDROCODONE-ACETAMINOPHEN 5-325 MG PO TABS: 1.0000 | ORAL_TABLET | ORAL | 0 refills | Status: DC | PRN

## 2015-12-29 MED ORDER — MORPHINE SULFATE (PF) 4 MG/ML IV SOLN
6.0000 mg | Freq: Once | INTRAVENOUS | Status: AC
  Administered 2015-12-29: 6 mg via INTRAVENOUS
  Filled 2015-12-29: qty 2

## 2015-12-29 MED ORDER — CLINDAMYCIN HCL 300 MG PO CAPS
300.0000 mg | ORAL_CAPSULE | Freq: Once | ORAL | Status: AC
  Administered 2015-12-29: 300 mg via ORAL
  Filled 2015-12-29: qty 1

## 2015-12-29 MED ORDER — IOPAMIDOL (ISOVUE-300) INJECTION 61%
INTRAVENOUS | Status: AC
  Filled 2015-12-29: qty 75

## 2015-12-29 MED ORDER — SODIUM CHLORIDE 0.9 % IJ SOLN
INTRAMUSCULAR | Status: AC
  Filled 2015-12-29: qty 50

## 2015-12-29 MED ORDER — OXYCODONE-ACETAMINOPHEN 5-325 MG PO TABS
1.0000 | ORAL_TABLET | Freq: Once | ORAL | Status: AC
  Administered 2015-12-29: 1 via ORAL
  Filled 2015-12-29: qty 1

## 2015-12-29 MED ORDER — IOPAMIDOL (ISOVUE-300) INJECTION 61%
75.0000 mL | Freq: Once | INTRAVENOUS | Status: AC | PRN
  Administered 2015-12-29: 75 mL via INTRAVENOUS

## 2015-12-29 MED ORDER — CLINDAMYCIN HCL 300 MG PO CAPS: 300.0000 mg | ORAL_CAPSULE | Freq: Three times a day (TID) | ORAL | 0 refills | Status: DC

## 2015-12-30 DIAGNOSIS — L089 Local infection of the skin and subcutaneous tissue, unspecified: Secondary | ICD-10-CM | POA: Diagnosis not present

## 2015-12-31 DIAGNOSIS — R221 Localized swelling, mass and lump, neck: Secondary | ICD-10-CM | POA: Diagnosis not present

## 2016-03-26 DIAGNOSIS — E7800 Pure hypercholesterolemia, unspecified: Secondary | ICD-10-CM | POA: Diagnosis not present

## 2016-03-31 DIAGNOSIS — E7800 Pure hypercholesterolemia, unspecified: Secondary | ICD-10-CM | POA: Diagnosis not present

## 2016-06-01 DIAGNOSIS — M353 Polymyalgia rheumatica: Secondary | ICD-10-CM | POA: Diagnosis not present

## 2016-06-01 DIAGNOSIS — H40003 Preglaucoma, unspecified, bilateral: Secondary | ICD-10-CM | POA: Diagnosis not present

## 2016-06-01 DIAGNOSIS — H2513 Age-related nuclear cataract, bilateral: Secondary | ICD-10-CM | POA: Diagnosis not present

## 2016-09-18 DIAGNOSIS — E039 Hypothyroidism, unspecified: Secondary | ICD-10-CM | POA: Diagnosis not present

## 2016-09-18 DIAGNOSIS — Z125 Encounter for screening for malignant neoplasm of prostate: Secondary | ICD-10-CM | POA: Diagnosis not present

## 2016-09-18 DIAGNOSIS — Z0000 Encounter for general adult medical examination without abnormal findings: Secondary | ICD-10-CM | POA: Diagnosis not present

## 2016-09-18 DIAGNOSIS — E7800 Pure hypercholesterolemia, unspecified: Secondary | ICD-10-CM | POA: Diagnosis not present

## 2016-09-23 DIAGNOSIS — N400 Enlarged prostate without lower urinary tract symptoms: Secondary | ICD-10-CM | POA: Diagnosis not present

## 2016-09-23 DIAGNOSIS — Z0000 Encounter for general adult medical examination without abnormal findings: Secondary | ICD-10-CM | POA: Diagnosis not present

## 2016-09-23 DIAGNOSIS — E7800 Pure hypercholesterolemia, unspecified: Secondary | ICD-10-CM | POA: Diagnosis not present

## 2016-09-23 DIAGNOSIS — G4733 Obstructive sleep apnea (adult) (pediatric): Secondary | ICD-10-CM | POA: Diagnosis not present

## 2016-09-24 ENCOUNTER — Other Ambulatory Visit: Payer: Self-pay | Admitting: Internal Medicine

## 2016-09-24 DIAGNOSIS — R0989 Other specified symptoms and signs involving the circulatory and respiratory systems: Secondary | ICD-10-CM

## 2016-10-01 ENCOUNTER — Ambulatory Visit
Admission: RE | Admit: 2016-10-01 | Discharge: 2016-10-01 | Disposition: A | Discharge status: Home / Self Care | Payer: TRICARE For Life (TFL) | Source: Ambulatory Visit | Attending: Internal Medicine | Admitting: Internal Medicine

## 2016-10-01 DIAGNOSIS — R0989 Other specified symptoms and signs involving the circulatory and respiratory systems: Secondary | ICD-10-CM

## 2016-10-01 DIAGNOSIS — I6523 Occlusion and stenosis of bilateral carotid arteries: Secondary | ICD-10-CM | POA: Diagnosis not present

## 2016-10-23 DIAGNOSIS — Z23 Encounter for immunization: Secondary | ICD-10-CM | POA: Diagnosis not present

## 2016-10-23 DIAGNOSIS — R0989 Other specified symptoms and signs involving the circulatory and respiratory systems: Secondary | ICD-10-CM | POA: Diagnosis not present

## 2016-10-23 DIAGNOSIS — I6523 Occlusion and stenosis of bilateral carotid arteries: Secondary | ICD-10-CM | POA: Diagnosis not present

## 2016-10-27 ENCOUNTER — Other Ambulatory Visit: Payer: Self-pay

## 2016-10-27 DIAGNOSIS — I708 Atherosclerosis of other arteries: Secondary | ICD-10-CM

## 2016-10-27 DIAGNOSIS — I7090 Unspecified atherosclerosis: Secondary | ICD-10-CM

## 2016-11-02 ENCOUNTER — Encounter: Payer: Self-pay | Admitting: Vascular Surgery

## 2016-11-03 ENCOUNTER — Ambulatory Visit (HOSPITAL_COMMUNITY)
Admission: RE | Admit: 2016-11-03 | Discharge: 2016-11-03 | Disposition: A | Discharge status: Home / Self Care | Payer: Medicare Other | Source: Ambulatory Visit | Attending: Vascular Surgery | Admitting: Vascular Surgery

## 2016-11-03 ENCOUNTER — Ambulatory Visit (INDEPENDENT_AMBULATORY_CARE_PROVIDER_SITE_OTHER): Payer: Medicare Other | Admitting: Vascular Surgery

## 2016-11-03 ENCOUNTER — Encounter: Payer: Self-pay | Admitting: Vascular Surgery

## 2016-11-03 VITALS — BP 125/75 | HR 70 | Temp 97.2°F | Resp 18 | Ht 69.5 in | Wt 203.0 lb

## 2016-11-03 DIAGNOSIS — M79603 Pain in arm, unspecified: Secondary | ICD-10-CM | POA: Diagnosis not present

## 2016-11-03 DIAGNOSIS — I708 Atherosclerosis of other arteries: Secondary | ICD-10-CM

## 2016-11-03 DIAGNOSIS — I7090 Unspecified atherosclerosis: Secondary | ICD-10-CM

## 2016-11-03 DIAGNOSIS — I771 Stricture of artery: Secondary | ICD-10-CM | POA: Diagnosis not present

## 2016-11-03 NOTE — Progress Notes (Signed)
Vitals:   11/03/16 1119 11/03/16 1129  BP: 121/64 125/75  Pulse: 70 70  Resp: 18   Temp: (!) 97.2 F (36.2 C)   SpO2: 99%   Weight: 203 lb (92.1 kg)   Height: 5' 9.5" (1.765 m)

## 2016-11-03 NOTE — Progress Notes (Signed)
[facility] [place]  Patient name: Evan Myers MRN: [id number] DOB: 1940-09-27 Sex: male  REASON FOR CONSULT: Differential blood pressure right versus left arm  HPI: Evan Myers is a 76 y.o. male, who is seen today for discussion of differential blood pressures right versus left arm. He underwent carotid duplex on 10/01/2016 for carotid bruit. He had no prior symptoms of stroke. Specifically no amaurosis fugax or aphasia or unilateral weakness. He denies any fatigue in his right arm left arm and has no significant dizziness. Is a carotid duplex was unremarkable was noted to have a systolic blood pressure 1 18 mmHg on the right and 139 mmHg on the left and is seen today for further discussion of this. No history of severe cardiac disease  Past Medical History:  Diagnosis Date  . Actinic keratosis   . Adenomatous polyp of colon   . Allergic rhinitis   . Aortic stenosis   . GERD (gastroesophageal reflux disease)   . Heart murmur   . Hemorrhoids   . HOH (hard of hearing)   . 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 Hyperlipidemia   . Mild CAD    via cath in 2008 (per office notes)  . Polymyalgia rheumatica ([place])   . Restless leg syndrome   . Tinnitus   . Urticaria   . Varicose veins of left lower extremity     Family History  Problem Relation Age of Onset  . Hypertension Mother   . Hyperlipidemia Mother   . Heart failure Mother   . Skin cancer Mother   . Heart attack Father 97  . Hypertension Father   . COPD Father   . Emphysema Father   . Stroke Father   . Hypotension Maternal Grandmother   . Diabetes Paternal Grandmother   . Nephrolithiasis Brother        half - sibling  . Hypertension Brother        half - sibling  . Hyperlipidemia Brother        half - sibling  . Hypertension Sister   . SIDS Brother        9 months, half - sibling    SOCIAL HISTORY: Social History   Social History  . Marital status:  Married    Spouse name: N/A  . Number of children: 2  . Years of education: bachelor's   Occupational History  . electronics/engineering    Social History Main Topics  . Smoking status: Former Smoker    Packs/day: 1.00    Years: 6.00    Types: Cigarettes    Quit date: 09/08/1968  . Smokeless tobacco: Never Used  . Alcohol use No  . Drug use: No  . Sexual activity: Not on file   Other Topics Concern  . Not on file   Social History Narrative  . No narrative on file    Allergies  Allergen Reactions  . Doxycycline Other (See Comments)    constipation    Current Outpatient Prescriptions  Medication Sig Dispense Refill  . alfuzosin (UROXATRAL) 10 MG 24 hr tablet Take 10 mg by mouth daily.     Marland Kitchen aspirin 81 MG tablet Take 81 mg by mouth daily.    . Bromelain 250 MG CAPS Take 250 mg by mouth 2 (two) times daily after a meal.    . cetirizine (ZYRTEC) 10 MG tablet Take 10 mg by mouth daily.    . fluticasone (FLONASE) 50 MCG/ACT nasal spray Place into both  nostrils daily as needed for allergies or rhinitis.    . Multiple Vitamins-Minerals (CENTRUM SILVER PO) Take by mouth daily.    . pantoprazole (PROTONIX) 40 MG tablet Take 40 mg by mouth daily.    . Pitavastatin Calcium (LIVALO) 2 MG TABS Take 1 mg by mouth daily.    Marland Kitchen atorvastatin (LIPITOR) 10 MG tablet Take 5 mg by mouth daily.    . clindamycin (CLEOCIN) 300 MG capsule Take 1 capsule (300 mg total) by mouth 3 (three) times daily. (Patient not taking: Reported on 11/03/2016) 21 capsule 0  . HYDROcodone-acetaminophen (NORCO/VICODIN) 5-325 MG tablet Take 1 tablet by mouth every 4 (four) hours as needed for moderate pain. (Patient not taking: Reported on 11/03/2016) 12 tablet 0  . omeprazole (PRILOSEC) 40 MG capsule Take 40 mg by mouth daily.     No current facility-administered medications for this visit.     REVIEW OF SYSTEMS:  [X]  denotes positive finding, [ ]  denotes negative finding Cardiac  Comments:  Chest pain or chest  pressure:    Shortness of breath upon exertion:    Short of breath when lying flat:    Irregular heart rhythm:        Vascular    Pain in calf, thigh, or hip brought on by ambulation:    Pain in feet at night that wakes you up from your sleep:     Blood clot in your veins:    Leg swelling:         Pulmonary    Oxygen at home:    Productive cough:     Wheezing:         Neurologic    Sudden weakness in arms or legs:     Sudden numbness in arms or legs:     Sudden onset of difficulty speaking or slurred speech:    Temporary loss of vision in one eye:     Problems with dizziness:         Gastrointestinal    Blood in stool:     Vomited blood:         Genitourinary    Burning when urinating:     Blood in urine:        Psychiatric    Major depression:         Hematologic    Bleeding problems:    Problems with blood clotting too easily:        Skin    Rashes or ulcers:        Constitutional    Fever or chills:      PHYSICAL EXAM: Vitals:   11/03/16 1119 11/03/16 1129  BP: 121/64 125/75  Pulse: 70 70  Resp: 18   Temp: (!) 97.2 F (36.2 C)   SpO2: 99%   Weight: 203 lb (92.1 kg)   Height: 5' 9.5" (1.765 m)     GENERAL: The patient is a well-nourished male, in no acute distress. The vital signs are documented above. CARDIOVASCULAR: I do not appreciate carotid bruits today by physical exam. He does have a 2+ radial and 2+ ulnar pulses bilaterally. 2+ dorsalis pedis pulses bilaterally PULMONARY: There is good air exchange  ABDOMEN: Soft and non-tender  MUSCULOSKELETAL: There are no major deformities or cyanosis. NEUROLOGIC: No focal weakness or paresthesias are detected. SKIN: There are no ulcers or rashes noted. PSYCHIATRIC: The patient has a normal affect.  DATA:  I reviewed his carotid duplex and he also underwent the upper extremity arterial duplex in  our office today showing completely normal triphasic waveforms throughout the subclavian axillary brachial  and more distal arteries.  MEDICAL ISSUES: Had long discussion with patient and his wife. Explained that we are unable to see any evidence of subclavian occlusive disease. Also explained that even if this was present would not recommend any treatment as long as this remains asymptomatic. He was reassured this discussion will see [place] again on as-needed basis   Rosetta Posner, MD [facility] Tel [phone] Pager [phone]

## 2016-11-09 DIAGNOSIS — I6523 Occlusion and stenosis of bilateral carotid arteries: Secondary | ICD-10-CM | POA: Diagnosis not present

## 2016-11-19 DIAGNOSIS — H01003 Unspecified blepharitis right eye, unspecified eyelid: Secondary | ICD-10-CM | POA: Diagnosis not present

## 2016-11-23 DIAGNOSIS — H40023 Open angle with borderline findings, high risk, bilateral: Secondary | ICD-10-CM | POA: Diagnosis not present

## 2016-11-23 DIAGNOSIS — H524 Presbyopia: Secondary | ICD-10-CM | POA: Diagnosis not present

## 2016-11-23 DIAGNOSIS — I6523 Occlusion and stenosis of bilateral carotid arteries: Secondary | ICD-10-CM | POA: Diagnosis not present

## 2016-11-23 DIAGNOSIS — H01003 Unspecified blepharitis right eye, unspecified eyelid: Secondary | ICD-10-CM | POA: Diagnosis not present

## 2016-11-23 DIAGNOSIS — H40051 Ocular hypertension, right eye: Secondary | ICD-10-CM | POA: Diagnosis not present

## 2016-11-26 ENCOUNTER — Encounter: Payer: Medicare Other | Admitting: Vascular Surgery

## 2016-11-26 ENCOUNTER — Other Ambulatory Visit (HOSPITAL_COMMUNITY): Payer: Medicare Other

## 2016-12-22 DIAGNOSIS — H40023 Open angle with borderline findings, high risk, bilateral: Secondary | ICD-10-CM | POA: Diagnosis not present

## 2016-12-22 DIAGNOSIS — H40051 Ocular hypertension, right eye: Secondary | ICD-10-CM | POA: Diagnosis not present

## 2016-12-22 DIAGNOSIS — H10011 Acute follicular conjunctivitis, right eye: Secondary | ICD-10-CM | POA: Diagnosis not present

## 2017-03-15 DIAGNOSIS — H40023 Open angle with borderline findings, high risk, bilateral: Secondary | ICD-10-CM | POA: Diagnosis not present

## 2017-03-15 DIAGNOSIS — H40051 Ocular hypertension, right eye: Secondary | ICD-10-CM | POA: Diagnosis not present

## 2017-03-15 DIAGNOSIS — H10011 Acute follicular conjunctivitis, right eye: Secondary | ICD-10-CM | POA: Diagnosis not present

## 2017-03-22 DIAGNOSIS — H40051 Ocular hypertension, right eye: Secondary | ICD-10-CM | POA: Diagnosis not present

## 2017-03-22 DIAGNOSIS — H10011 Acute follicular conjunctivitis, right eye: Secondary | ICD-10-CM | POA: Diagnosis not present

## 2017-05-13 DIAGNOSIS — H40023 Open angle with borderline findings, high risk, bilateral: Secondary | ICD-10-CM | POA: Diagnosis not present

## 2017-05-13 DIAGNOSIS — H40051 Ocular hypertension, right eye: Secondary | ICD-10-CM | POA: Diagnosis not present

## 2017-05-13 DIAGNOSIS — H10011 Acute follicular conjunctivitis, right eye: Secondary | ICD-10-CM | POA: Diagnosis not present

## 2017-05-13 DIAGNOSIS — H01003 Unspecified blepharitis right eye, unspecified eyelid: Secondary | ICD-10-CM | POA: Diagnosis not present

## 2017-06-03 DIAGNOSIS — N410 Acute prostatitis: Secondary | ICD-10-CM | POA: Diagnosis not present

## 2017-06-03 DIAGNOSIS — R319 Hematuria, unspecified: Secondary | ICD-10-CM | POA: Diagnosis not present

## 2017-06-03 DIAGNOSIS — N390 Urinary tract infection, site not specified: Secondary | ICD-10-CM | POA: Diagnosis not present

## 2017-06-07 DIAGNOSIS — H40003 Preglaucoma, unspecified, bilateral: Secondary | ICD-10-CM | POA: Diagnosis not present

## 2017-06-07 DIAGNOSIS — M353 Polymyalgia rheumatica: Secondary | ICD-10-CM | POA: Diagnosis not present

## 2017-06-07 DIAGNOSIS — H2513 Age-related nuclear cataract, bilateral: Secondary | ICD-10-CM | POA: Diagnosis not present

## 2017-06-10 DIAGNOSIS — N289 Disorder of kidney and ureter, unspecified: Secondary | ICD-10-CM | POA: Diagnosis not present

## 2017-06-10 DIAGNOSIS — N410 Acute prostatitis: Secondary | ICD-10-CM | POA: Diagnosis not present

## 2017-06-14 DIAGNOSIS — N289 Disorder of kidney and ureter, unspecified: Secondary | ICD-10-CM | POA: Diagnosis not present

## 2017-06-18 DIAGNOSIS — N289 Disorder of kidney and ureter, unspecified: Secondary | ICD-10-CM | POA: Diagnosis not present

## 2017-07-05 DIAGNOSIS — N411 Chronic prostatitis: Secondary | ICD-10-CM | POA: Diagnosis not present

## 2017-07-05 DIAGNOSIS — N401 Enlarged prostate with lower urinary tract symptoms: Secondary | ICD-10-CM | POA: Diagnosis not present

## 2017-07-05 DIAGNOSIS — E291 Testicular hypofunction: Secondary | ICD-10-CM | POA: Diagnosis not present

## 2017-07-05 DIAGNOSIS — N138 Other obstructive and reflux uropathy: Secondary | ICD-10-CM | POA: Diagnosis not present

## 2017-09-24 DIAGNOSIS — Z125 Encounter for screening for malignant neoplasm of prostate: Secondary | ICD-10-CM | POA: Diagnosis not present

## 2017-09-24 DIAGNOSIS — E7211 Homocystinuria: Secondary | ICD-10-CM | POA: Diagnosis not present

## 2017-09-24 DIAGNOSIS — E039 Hypothyroidism, unspecified: Secondary | ICD-10-CM | POA: Diagnosis not present

## 2017-09-24 DIAGNOSIS — E7800 Pure hypercholesterolemia, unspecified: Secondary | ICD-10-CM | POA: Diagnosis not present

## 2017-09-28 DIAGNOSIS — Z0000 Encounter for general adult medical examination without abnormal findings: Secondary | ICD-10-CM | POA: Diagnosis not present

## 2017-09-28 DIAGNOSIS — Z23 Encounter for immunization: Secondary | ICD-10-CM | POA: Diagnosis not present

## 2017-09-28 DIAGNOSIS — E7800 Pure hypercholesterolemia, unspecified: Secondary | ICD-10-CM | POA: Diagnosis not present

## 2017-09-28 DIAGNOSIS — R0989 Other specified symptoms and signs involving the circulatory and respiratory systems: Secondary | ICD-10-CM | POA: Diagnosis not present

## 2017-09-28 DIAGNOSIS — R011 Cardiac murmur, unspecified: Secondary | ICD-10-CM | POA: Diagnosis not present

## 2017-09-28 DIAGNOSIS — E039 Hypothyroidism, unspecified: Secondary | ICD-10-CM | POA: Diagnosis not present

## 2017-10-04 DIAGNOSIS — N138 Other obstructive and reflux uropathy: Secondary | ICD-10-CM | POA: Diagnosis not present

## 2017-10-04 DIAGNOSIS — N411 Chronic prostatitis: Secondary | ICD-10-CM | POA: Diagnosis not present

## 2017-10-07 DIAGNOSIS — R011 Cardiac murmur, unspecified: Secondary | ICD-10-CM | POA: Diagnosis not present

## 2017-10-14 DIAGNOSIS — H10011 Acute follicular conjunctivitis, right eye: Secondary | ICD-10-CM | POA: Diagnosis not present

## 2017-10-14 DIAGNOSIS — H40023 Open angle with borderline findings, high risk, bilateral: Secondary | ICD-10-CM | POA: Diagnosis not present

## 2017-10-14 DIAGNOSIS — H40051 Ocular hypertension, right eye: Secondary | ICD-10-CM | POA: Diagnosis not present

## 2017-10-14 DIAGNOSIS — H01003 Unspecified blepharitis right eye, unspecified eyelid: Secondary | ICD-10-CM | POA: Diagnosis not present

## 2017-11-10 DIAGNOSIS — H40023 Open angle with borderline findings, high risk, bilateral: Secondary | ICD-10-CM | POA: Diagnosis not present

## 2017-11-10 DIAGNOSIS — H40051 Ocular hypertension, right eye: Secondary | ICD-10-CM | POA: Diagnosis not present

## 2017-11-12 ENCOUNTER — Other Ambulatory Visit: Payer: Self-pay | Admitting: Surgery

## 2017-11-12 DIAGNOSIS — H40059 Ocular hypertension, unspecified eye: Secondary | ICD-10-CM

## 2017-11-16 ENCOUNTER — Other Ambulatory Visit: Payer: Self-pay | Admitting: Surgery

## 2017-11-16 DIAGNOSIS — H40059 Ocular hypertension, unspecified eye: Secondary | ICD-10-CM

## 2017-11-19 ENCOUNTER — Other Ambulatory Visit: Payer: Medicare Other

## 2017-11-20 ENCOUNTER — Other Ambulatory Visit: Payer: Medicare Other

## 2017-11-23 ENCOUNTER — Ambulatory Visit
Admission: RE | Admit: 2017-11-23 | Discharge: 2017-11-23 | Disposition: A | Discharge status: Home / Self Care | Payer: Medicare Other | Source: Ambulatory Visit | Attending: Surgery | Admitting: Surgery

## 2017-11-23 DIAGNOSIS — H40059 Ocular hypertension, unspecified eye: Secondary | ICD-10-CM

## 2017-11-23 DIAGNOSIS — H40051 Ocular hypertension, right eye: Secondary | ICD-10-CM | POA: Diagnosis not present

## 2017-11-23 MED ORDER — GADOBENATE DIMEGLUMINE 529 MG/ML IV SOLN
17.0000 mL | Freq: Once | INTRAVENOUS | Status: AC | PRN
  Administered 2017-11-23: 17 mL via INTRAVENOUS

## 2017-11-24 ENCOUNTER — Ambulatory Visit
Admission: RE | Admit: 2017-11-24 | Discharge: 2017-11-24 | Disposition: A | Discharge status: Home / Self Care | Payer: Medicare Other | Source: Ambulatory Visit | Attending: Surgery | Admitting: Surgery

## 2017-11-24 DIAGNOSIS — H40059 Ocular hypertension, unspecified eye: Secondary | ICD-10-CM

## 2017-11-24 DIAGNOSIS — H40051 Ocular hypertension, right eye: Secondary | ICD-10-CM | POA: Diagnosis not present

## 2017-12-08 DIAGNOSIS — H40051 Ocular hypertension, right eye: Secondary | ICD-10-CM | POA: Diagnosis not present

## 2017-12-08 DIAGNOSIS — H40023 Open angle with borderline findings, high risk, bilateral: Secondary | ICD-10-CM | POA: Diagnosis not present

## 2018-01-05 DIAGNOSIS — H00011 Hordeolum externum right upper eyelid: Secondary | ICD-10-CM | POA: Diagnosis not present

## 2018-01-05 DIAGNOSIS — H40051 Ocular hypertension, right eye: Secondary | ICD-10-CM | POA: Diagnosis not present

## 2018-01-05 DIAGNOSIS — H40023 Open angle with borderline findings, high risk, bilateral: Secondary | ICD-10-CM | POA: Diagnosis not present

## 2018-02-01 DIAGNOSIS — H40021 Open angle with borderline findings, high risk, right eye: Secondary | ICD-10-CM | POA: Diagnosis not present

## 2018-02-01 DIAGNOSIS — H40051 Ocular hypertension, right eye: Secondary | ICD-10-CM | POA: Diagnosis not present

## 2018-03-01 DIAGNOSIS — H40051 Ocular hypertension, right eye: Secondary | ICD-10-CM | POA: Diagnosis not present

## 2018-03-01 DIAGNOSIS — H40023 Open angle with borderline findings, high risk, bilateral: Secondary | ICD-10-CM | POA: Diagnosis not present

## 2018-03-01 DIAGNOSIS — H01003 Unspecified blepharitis right eye, unspecified eyelid: Secondary | ICD-10-CM | POA: Diagnosis not present

## 2018-03-01 DIAGNOSIS — H10011 Acute follicular conjunctivitis, right eye: Secondary | ICD-10-CM | POA: Diagnosis not present

## 2018-04-11 DIAGNOSIS — H40023 Open angle with borderline findings, high risk, bilateral: Secondary | ICD-10-CM | POA: Diagnosis not present

## 2018-04-11 DIAGNOSIS — H40051 Ocular hypertension, right eye: Secondary | ICD-10-CM | POA: Diagnosis not present

## 2018-07-12 DIAGNOSIS — H25013 Cortical age-related cataract, bilateral: Secondary | ICD-10-CM | POA: Diagnosis not present

## 2018-07-12 DIAGNOSIS — H2513 Age-related nuclear cataract, bilateral: Secondary | ICD-10-CM | POA: Diagnosis not present

## 2018-07-12 DIAGNOSIS — H40023 Open angle with borderline findings, high risk, bilateral: Secondary | ICD-10-CM | POA: Diagnosis not present

## 2018-07-12 DIAGNOSIS — H21231 Degeneration of iris (pigmentary), right eye: Secondary | ICD-10-CM | POA: Diagnosis not present

## 2018-09-07 DIAGNOSIS — N63 Unspecified lump in unspecified breast: Secondary | ICD-10-CM | POA: Diagnosis not present

## 2018-09-08 ENCOUNTER — Other Ambulatory Visit: Payer: Self-pay | Admitting: Internal Medicine

## 2018-09-08 DIAGNOSIS — N63 Unspecified lump in unspecified breast: Secondary | ICD-10-CM

## 2018-09-08 DIAGNOSIS — N6320 Unspecified lump in the left breast, unspecified quadrant: Secondary | ICD-10-CM

## 2018-09-16 ENCOUNTER — Ambulatory Visit
Admission: RE | Admit: 2018-09-16 | Discharge: 2018-09-16 | Disposition: A | Discharge status: Home / Self Care | Payer: Medicare Other | Source: Ambulatory Visit | Attending: Internal Medicine | Admitting: Internal Medicine

## 2018-09-16 ENCOUNTER — Other Ambulatory Visit: Payer: Self-pay

## 2018-09-16 ENCOUNTER — Ambulatory Visit: Admission: RE | Admit: 2018-09-16 | Payer: Medicare Other | Source: Ambulatory Visit

## 2018-09-16 DIAGNOSIS — N644 Mastodynia: Secondary | ICD-10-CM | POA: Diagnosis not present

## 2018-09-16 DIAGNOSIS — N6320 Unspecified lump in the left breast, unspecified quadrant: Secondary | ICD-10-CM

## 2018-09-16 DIAGNOSIS — N63 Unspecified lump in unspecified breast: Secondary | ICD-10-CM

## 2018-10-05 DIAGNOSIS — E039 Hypothyroidism, unspecified: Secondary | ICD-10-CM | POA: Diagnosis not present

## 2018-10-05 DIAGNOSIS — E7800 Pure hypercholesterolemia, unspecified: Secondary | ICD-10-CM | POA: Diagnosis not present

## 2018-10-06 DIAGNOSIS — I6523 Occlusion and stenosis of bilateral carotid arteries: Secondary | ICD-10-CM | POA: Diagnosis not present

## 2018-10-06 DIAGNOSIS — E7800 Pure hypercholesterolemia, unspecified: Secondary | ICD-10-CM | POA: Diagnosis not present

## 2018-10-06 DIAGNOSIS — Z872 Personal history of diseases of the skin and subcutaneous tissue: Secondary | ICD-10-CM | POA: Diagnosis not present

## 2018-10-06 DIAGNOSIS — Z0000 Encounter for general adult medical examination without abnormal findings: Secondary | ICD-10-CM | POA: Diagnosis not present

## 2018-10-10 DIAGNOSIS — N411 Chronic prostatitis: Secondary | ICD-10-CM | POA: Diagnosis not present

## 2018-10-10 DIAGNOSIS — N138 Other obstructive and reflux uropathy: Secondary | ICD-10-CM | POA: Diagnosis not present

## 2018-10-11 DIAGNOSIS — Z1211 Encounter for screening for malignant neoplasm of colon: Secondary | ICD-10-CM | POA: Diagnosis not present

## 2018-10-11 DIAGNOSIS — Z1212 Encounter for screening for malignant neoplasm of rectum: Secondary | ICD-10-CM | POA: Diagnosis not present

## 2018-10-19 DIAGNOSIS — I6523 Occlusion and stenosis of bilateral carotid arteries: Secondary | ICD-10-CM | POA: Diagnosis not present

## 2018-10-19 DIAGNOSIS — R011 Cardiac murmur, unspecified: Secondary | ICD-10-CM | POA: Diagnosis not present

## 2018-10-19 DIAGNOSIS — I350 Nonrheumatic aortic (valve) stenosis: Secondary | ICD-10-CM | POA: Diagnosis not present

## 2018-11-22 DIAGNOSIS — H2513 Age-related nuclear cataract, bilateral: Secondary | ICD-10-CM | POA: Diagnosis not present

## 2018-11-22 DIAGNOSIS — H40023 Open angle with borderline findings, high risk, bilateral: Secondary | ICD-10-CM | POA: Diagnosis not present

## 2018-11-22 DIAGNOSIS — H21231 Degeneration of iris (pigmentary), right eye: Secondary | ICD-10-CM | POA: Diagnosis not present

## 2018-11-22 DIAGNOSIS — H25013 Cortical age-related cataract, bilateral: Secondary | ICD-10-CM | POA: Diagnosis not present

## 2018-11-30 IMAGING — US US CAROTID DUPLEX BILAT
1 series · 13 of 24 positions shown · non-contrast
Comparison: No prior duplex

CLINICAL DATA: 76-year-old male with a history of bruit.

Cardiovascular risk factors include hyperlipidemia
EXAM:
BILATERAL CAROTID DUPLEX ULTRASOUND
TECHNIQUE: Gray scale imaging, color Doppler and duplex ultrasound were
performed of bilateral carotid and vertebral arteries in the neck.

[Series 1: us carotid duplex bilat · 0.06mm/px · 13 of 72 slices shown]
[im 1/72]
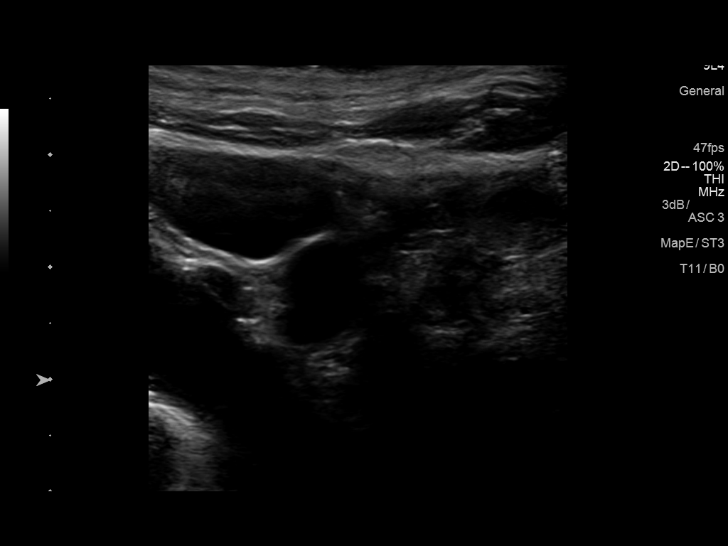
[im 7/72]
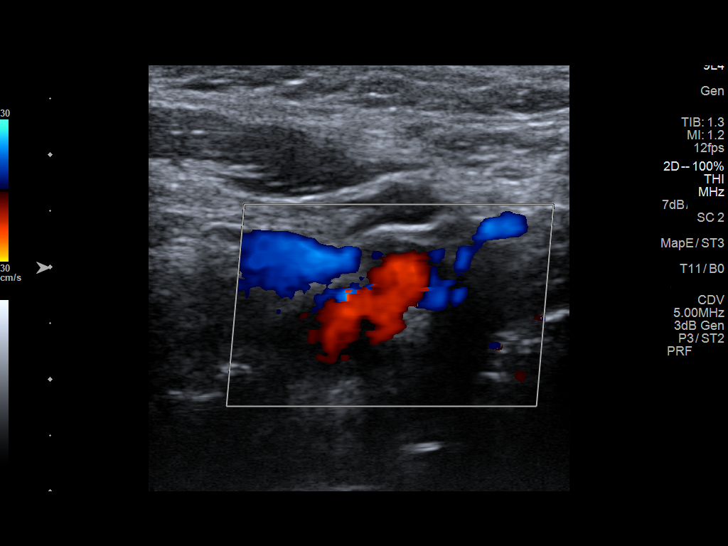
[im 13/72]
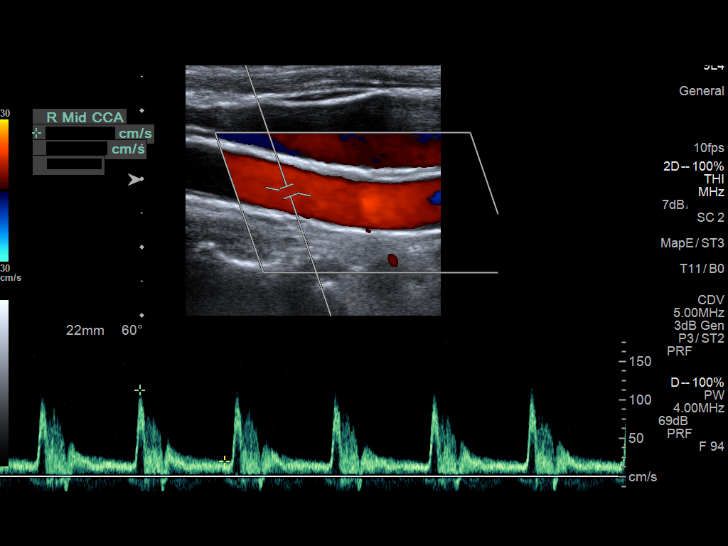
[im 19/72]
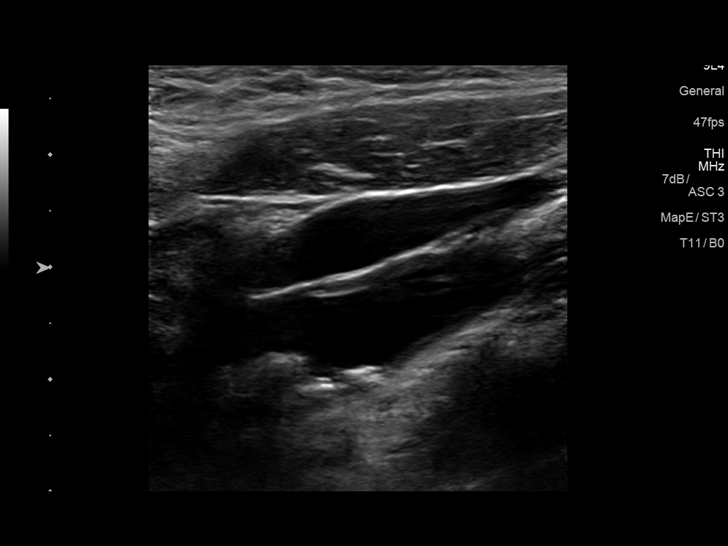
[im 25/72]
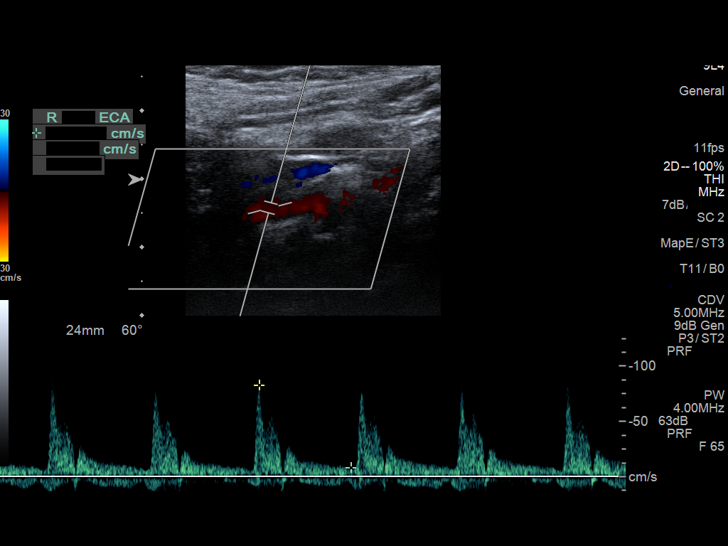
[im 31/72]
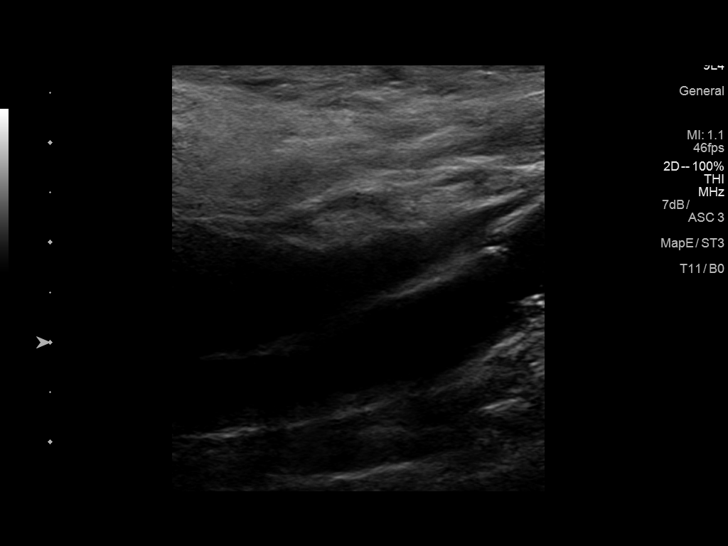
[im 38/72]
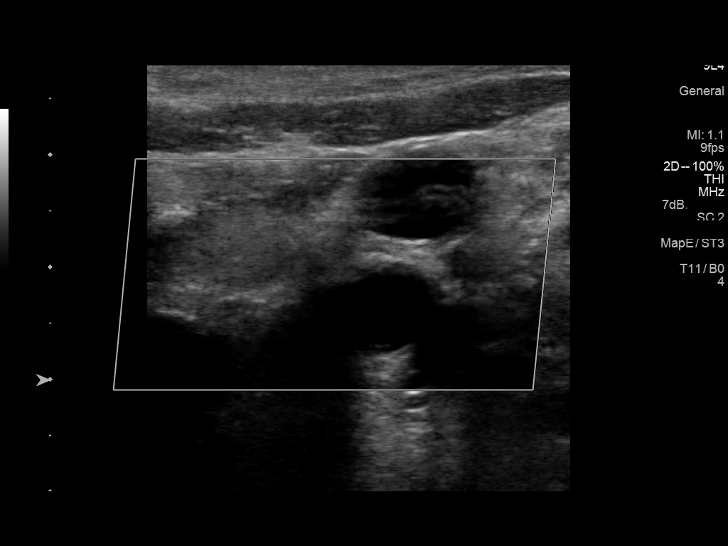
[im 41/72]
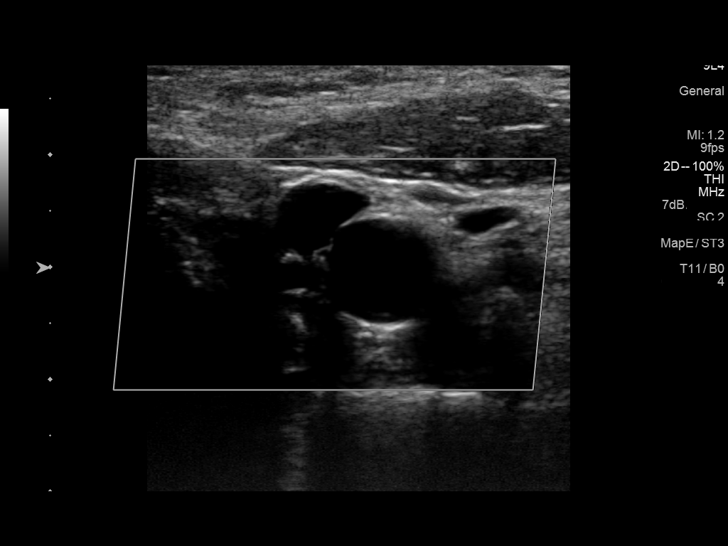
[im 47/72]
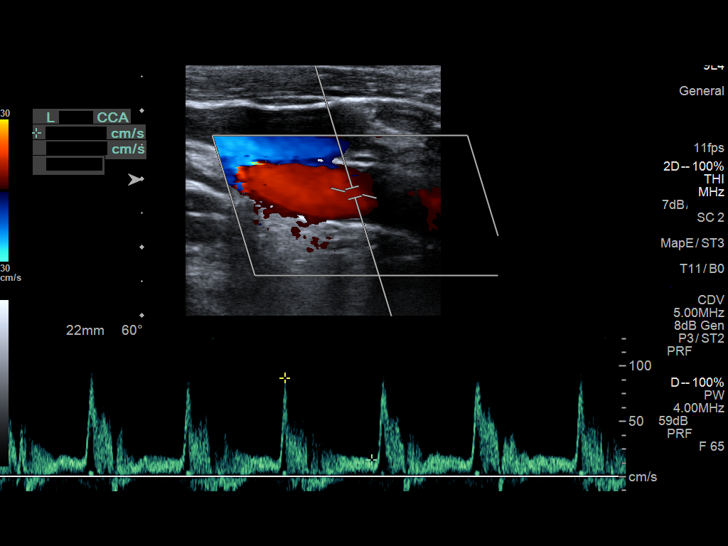
[im 53/72]
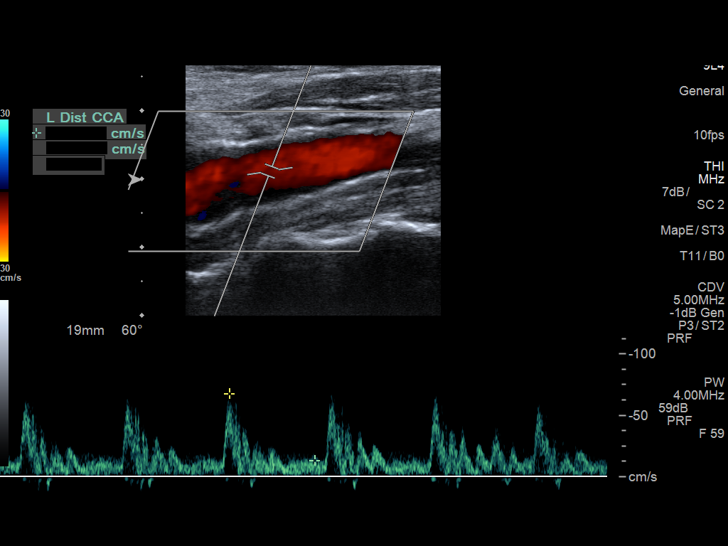
[im 59/72]
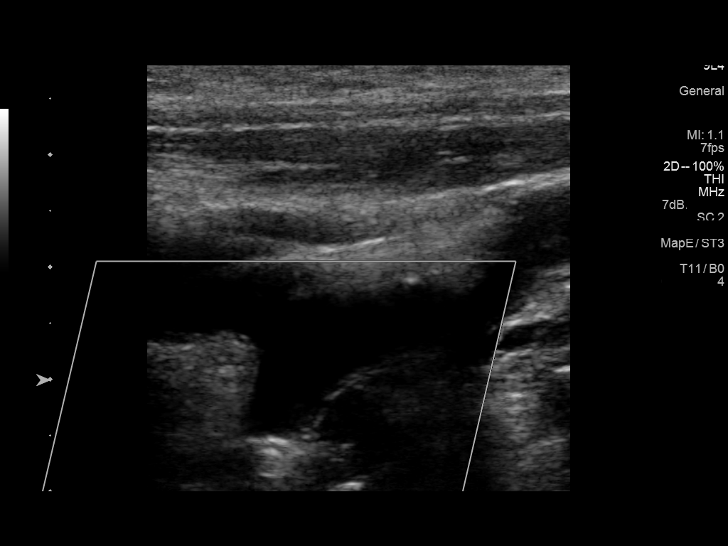
[im 65/72]
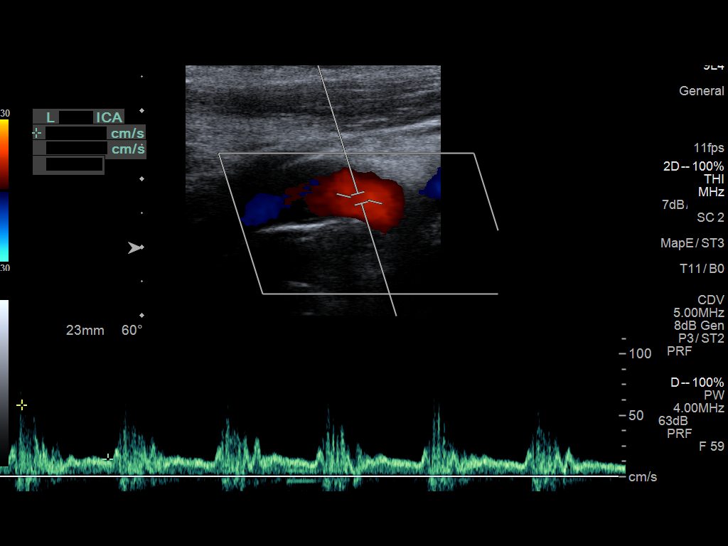
[im 72/72]
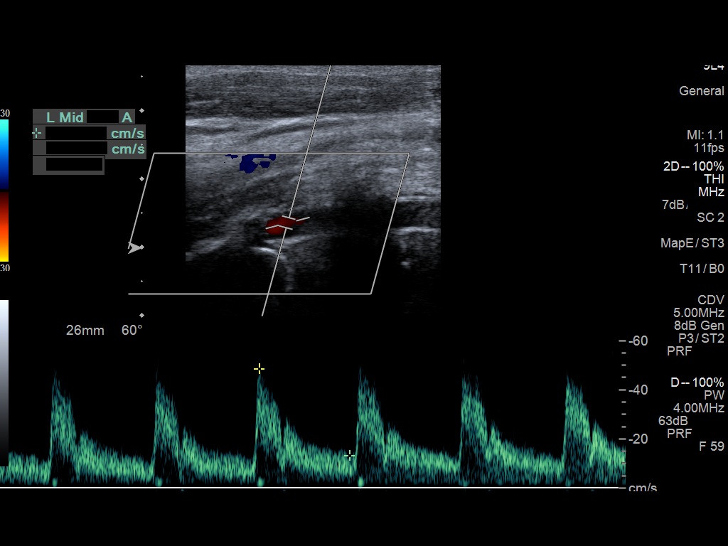

[13 of 24 positions shown; findings below may reference images not displayed]

FINDINGS: Criteria: Quantification of carotid stenosis is based on velocity
parameters that correlate the residual internal carotid diameter
with NASCET-based stenosis levels, using the diameter of the distal
internal carotid lumen as the denominator for stenosis measurement.

The following velocity measurements were obtained:

RIGHT

ICA:  Systolic 98 cm/sec, Diastolic 21 cm/sec

CCA:  114 cm/sec

SYSTOLIC ICA/CCA RATIO:

ECA:  83 cm/sec

LEFT

ICA:  Systolic 73 cm/sec, Diastolic 22 cm/sec

CCA:  112 cm/sec

SYSTOLIC ICA/CCA RATIO:

ECA:  57 cm/sec

Right Brachial SBP: 118

Left Brachial SBP: 139

RIGHT CAROTID ARTERY: No significant calcifications of the right
common carotid artery. Intermediate waveform maintained.
Heterogeneous and partially calcified plaque at the right carotid
bifurcation. No significant lumen shadowing. Low resistance waveform
of the right ICA. No significant tortuosity.

RIGHT VERTEBRAL ARTERY: Antegrade flow with low resistance waveform.

LEFT CAROTID ARTERY: No significant calcifications of the left
common carotid artery. Intermediate waveform maintained.
Heterogeneous and partially calcified plaque at the left carotid
bifurcation without significant lumen shadowing. Low resistance
waveform of the left ICA. No significant tortuosity.

LEFT VERTEBRAL ARTERY:  Antegrade flow with low resistance waveform.
IMPRESSION: Color duplex indicates mild heterogeneous and calcified plaque, with
no hemodynamically significant stenosis by duplex criteria in the
extracranial cerebrovascular circulation.

Differential blood pressure measurement of the upper extremities,
with the right measuring less than the left by greater than 15 mm
Hg, potentially representing developing arterial stenosis. Office
based assessment with repeat blood pressure cuff may be useful, as
well as correlation with any symptoms of upper extremity arterial
insufficiency.

## 2018-12-06 DIAGNOSIS — H25013 Cortical age-related cataract, bilateral: Secondary | ICD-10-CM | POA: Diagnosis not present

## 2018-12-06 DIAGNOSIS — H21231 Degeneration of iris (pigmentary), right eye: Secondary | ICD-10-CM | POA: Diagnosis not present

## 2018-12-06 DIAGNOSIS — H40023 Open angle with borderline findings, high risk, bilateral: Secondary | ICD-10-CM | POA: Diagnosis not present

## 2018-12-06 DIAGNOSIS — H2512 Age-related nuclear cataract, left eye: Secondary | ICD-10-CM | POA: Diagnosis not present

## 2018-12-06 DIAGNOSIS — H2513 Age-related nuclear cataract, bilateral: Secondary | ICD-10-CM | POA: Diagnosis not present

## 2018-12-14 DIAGNOSIS — H2511 Age-related nuclear cataract, right eye: Secondary | ICD-10-CM | POA: Diagnosis not present

## 2018-12-14 DIAGNOSIS — H40021 Open angle with borderline findings, high risk, right eye: Secondary | ICD-10-CM | POA: Diagnosis not present

## 2018-12-14 DIAGNOSIS — H25812 Combined forms of age-related cataract, left eye: Secondary | ICD-10-CM | POA: Diagnosis not present

## 2018-12-14 DIAGNOSIS — H40052 Ocular hypertension, left eye: Secondary | ICD-10-CM | POA: Diagnosis not present

## 2018-12-20 DIAGNOSIS — H25011 Cortical age-related cataract, right eye: Secondary | ICD-10-CM | POA: Diagnosis not present

## 2018-12-20 DIAGNOSIS — H2511 Age-related nuclear cataract, right eye: Secondary | ICD-10-CM | POA: Diagnosis not present

## 2019-01-04 DIAGNOSIS — H2511 Age-related nuclear cataract, right eye: Secondary | ICD-10-CM | POA: Diagnosis not present

## 2019-01-04 DIAGNOSIS — H25811 Combined forms of age-related cataract, right eye: Secondary | ICD-10-CM | POA: Diagnosis not present

## 2019-01-04 DIAGNOSIS — H40021 Open angle with borderline findings, high risk, right eye: Secondary | ICD-10-CM | POA: Diagnosis not present

## 2019-01-04 DIAGNOSIS — H40023 Open angle with borderline findings, high risk, bilateral: Secondary | ICD-10-CM | POA: Diagnosis not present

## 2019-01-04 DIAGNOSIS — H25011 Cortical age-related cataract, right eye: Secondary | ICD-10-CM | POA: Diagnosis not present

## 2019-01-04 DIAGNOSIS — H40051 Ocular hypertension, right eye: Secondary | ICD-10-CM | POA: Diagnosis not present

## 2019-02-13 ENCOUNTER — Ambulatory Visit: Payer: Medicare Other

## 2019-02-14 ENCOUNTER — Ambulatory Visit: Payer: Medicare Other | Attending: Internal Medicine

## 2019-02-14 DIAGNOSIS — Z23 Encounter for immunization: Secondary | ICD-10-CM

## 2019-02-14 NOTE — Progress Notes (Signed)
   Covid-19 Vaccination Clinic  Name:  Evan Myers    MRN: [id number] DOB: 1940-09-27  02/14/2019  [name] was observed post Covid-19 immunization for 15 minutes without incidence. He was provided with Vaccine Information Sheet and instruction to access the V-Safe system.   [name] was instructed to call 911 with any severe reactions post vaccine: Marland Kitchen Difficulty breathing  . Swelling of your face and throat  . A fast heartbeat  . A bad rash all over your body  . Dizziness and weakness    Immunizations Administered    Name Date Dose VIS Date Route   Pfizer COVID-19 Vaccine 02/14/2019  3:42 PM 0.3 mL 01/06/2019 Intramuscular   Manufacturer: [place]   Lot: [id number]   [place]: [id number]

## 2019-03-06 ENCOUNTER — Ambulatory Visit: Payer: Medicare Other | Attending: Internal Medicine

## 2019-03-06 DIAGNOSIS — Z23 Encounter for immunization: Secondary | ICD-10-CM

## 2019-03-06 NOTE — Progress Notes (Signed)
   Covid-19 Vaccination Clinic  Name:  Evan Myers    MRN: [id number] DOB: [birth year]  03/06/2019  Evan Myers was observed post Covid-19 immunization for 15 minutes without incidence. He was provided with Vaccine Information Sheet and instruction to access the V-Safe system.   Evan Myers was instructed to call 911 with any severe reactions post vaccine: Marland Kitchen Difficulty breathing  . Swelling of your face and throat  . A fast heartbeat  . A bad rash all over your body  . Dizziness and weakness    Immunizations Administered    Name Date Dose VIS Date Route   Pfizer COVID-19 Vaccine 03/06/2019  9:12 AM 0.3 mL 01/06/2019 Intramuscular   Manufacturer: [place]   Lot: [id number]   [place]: [id number]

## 2019-04-10 DIAGNOSIS — H40023 Open angle with borderline findings, high risk, bilateral: Secondary | ICD-10-CM | POA: Diagnosis not present

## 2019-04-10 DIAGNOSIS — Z961 Presence of intraocular lens: Secondary | ICD-10-CM | POA: Diagnosis not present

## 2019-05-01 DIAGNOSIS — C44702 Unspecified malignant neoplasm of skin of right lower limb, including hip: Secondary | ICD-10-CM | POA: Diagnosis not present

## 2019-05-01 DIAGNOSIS — I83893 Varicose veins of bilateral lower extremities with other complications: Secondary | ICD-10-CM | POA: Diagnosis not present

## 2019-05-01 DIAGNOSIS — B351 Tinea unguium: Secondary | ICD-10-CM | POA: Diagnosis not present

## 2019-05-01 DIAGNOSIS — M79605 Pain in left leg: Secondary | ICD-10-CM | POA: Diagnosis not present

## 2019-05-09 ENCOUNTER — Ambulatory Visit (INDEPENDENT_AMBULATORY_CARE_PROVIDER_SITE_OTHER): Payer: Medicare Other | Admitting: Family Medicine

## 2019-05-09 ENCOUNTER — Ambulatory Visit (INDEPENDENT_AMBULATORY_CARE_PROVIDER_SITE_OTHER): Payer: Medicare Other

## 2019-05-09 ENCOUNTER — Encounter: Payer: Self-pay | Admitting: Family Medicine

## 2019-05-09 ENCOUNTER — Other Ambulatory Visit: Payer: Self-pay

## 2019-05-09 VITALS — BP 122/60 | HR 59 | Ht 69.5 in | Wt 177.6 lb

## 2019-05-09 DIAGNOSIS — M545 Low back pain: Secondary | ICD-10-CM | POA: Diagnosis not present

## 2019-05-09 DIAGNOSIS — M5442 Lumbago with sciatica, left side: Secondary | ICD-10-CM

## 2019-05-09 DIAGNOSIS — G8929 Other chronic pain: Secondary | ICD-10-CM | POA: Diagnosis not present

## 2019-05-09 DIAGNOSIS — M79605 Pain in left leg: Secondary | ICD-10-CM

## 2019-05-09 DIAGNOSIS — B351 Tinea unguium: Secondary | ICD-10-CM | POA: Diagnosis not present

## 2019-05-09 MED ORDER — GABAPENTIN 300 MG PO CAPS: 300.0000 mg | ORAL_CAPSULE | Freq: Three times a day (TID) | ORAL | 1 refills | Status: DC | PRN

## 2019-05-09 MED ORDER — PREDNISONE 50 MG PO TABS: 50.0000 mg | ORAL_TABLET | Freq: Every day | ORAL | 0 refills | Status: DC

## 2019-05-09 NOTE — Patient Instructions (Addendum)
Thank you for coming in today. Do the home exercises we showed you.  Take the prednisone for 5 days.  Use gabapentin up to 3x daily for nerve pain as needed.  It may make you sleepy.   Consider Physical Therapy.   Let me know if you do not improve.  Recheck in 4 weeks especially if not better.   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   Please perform the exercise program that we have prepared for you and gone over in detail on a daily basis.  In addition to the handout you were provided you can access your program through: [email or web address]   Your unique program code is: [id number]    Hip Bursitis  Hip bursitis is swelling of a fluid-filled sac (bursa) in your hip joint. This swelling (inflammation) can be painful. This condition may come and go over time. What are the causes?  Injury to the hip.  Overuse of the muscles that surround the hip joint.  An earlier injury or surgery of the hip.  Arthritis or gout.  Diabetes.  Thyroid disease.  Infection.  In some cases, the cause may not be known. What are the signs or symptoms?  Mild or moderate pain in the hip area. Pain may get worse with movement.  Tenderness and swelling of the hip, especially on the outer side of the hip.  In rare cases, the bursa may become infected. This may cause: ? A fever. ? Warmth and redness in the area. Symptoms may come and go. How is this treated? This condition is treated by resting, icing, applying pressure (compression), and raising (elevating) the injured area. You may hear this called the RICE treatment. Treatment may also include:  Using crutches.  Draining fluid out of the bursa to help relieve swelling.  Giving a shot of (injecting) medicine that helps to reduce swelling (cortisone).  Other medicines if the bursa is infected. Follow these instructions at home: Managing pain, stiffness, and swelling   If told, put ice on the painful  area. ? Put ice in a plastic bag. ? Place a towel between your skin and the bag. ? Leave the ice on for 20 minutes, 2-3 times a day. ? Raise (elevate) your hip above the level of your heart as much as you can without pain. To do this, try putting a pillow under your hips while you lie down. Stop if this causes pain. Activity  Return to your normal activities as told by your doctor. Ask your doctor what activities are safe for you.  Rest and protect your hip as much as you can until you feel better. General instructions  Take over-the-counter and prescription medicines only as told by your doctor.  Wear wraps that put pressure on your hip (compression wraps) only as told by your doctor.  Do not use your hip to support your body weight until your doctor says that you can.  Use crutches as told by your doctor.  Gently rub and stretch your injured area as often as is comfortable.  Keep all follow-up visits as told by your doctor. This is important. How is this prevented?  Exercise regularly, as told by your doctor.  Warm up and stretch before being active.  Cool down and stretch after being active.  Avoid activities that bother your hip or cause pain.  Avoid sitting down for long periods at a time. Contact a doctor if:  You have a fever.  You get new symptoms.  You have trouble walking.  You have trouble doing everyday activities.  You have pain that gets worse.  You have pain that does not get better with medicine.  You get red skin on your hip area.  You get a feeling of warmth in your hip area. Get help right away if:  You cannot move your hip.  You have very bad pain. Summary  Hip bursitis is swelling of a fluid-filled sac (bursa) in your hip.  Hip bursitis can be painful.  Symptoms often come and go over time.  This condition is treated with rest, ice, compression, elevation, and medicines. This information is not intended to replace advice given to  you by your health care provider. Make sure you discuss any questions you have with your health care provider. Document Revised: 09/20/2017 Document Reviewed: 09/20/2017 Elsevier Patient Education  [place].    Radicular Pain Radicular pain is a type of pain that spreads from your back or neck along a spinal nerve. Spinal nerves are nerves that leave the spinal cord and go to the muscles. Radicular pain is sometimes called radiculopathy, radiculitis, or a pinched nerve. When you have this type of pain, you may also have weakness, numbness, or tingling in the area of your body that is supplied by the nerve. The pain may feel sharp and burning. Depending on which spinal nerve is affected, the pain may occur in the:  Neck area (cervical radicular pain). You may also feel pain, numbness, weakness, or tingling in the arms.  Mid-spine area (thoracic radicular pain). You would feel this pain in the back and chest. This type is rare.  Lower back area (lumbar radicular pain). You would feel this pain as low back pain. You may feel pain, numbness, weakness, or tingling in the buttocks or legs. Sciatica is a type of lumbar radicular pain that shoots down the back of the leg. Radicular pain occurs when one of the spinal nerves becomes irritated or squeezed (compressed). It is often caused by something pushing on a spinal nerve, such as one of the bones of the spine (vertebrae) or one of the round cushions between vertebrae (intervertebral disks). This can result from:  An injury.  Wear and tear or aging of a disk.  The growth of a bone spur that pushes on the nerve. Radicular pain often goes away when you follow instructions from your health care provider for relieving pain at home. Follow these instructions at home: Managing pain      If directed, put ice on the affected area: ? Put ice in a plastic bag. ? Place a towel between your skin and the bag. ? Leave the ice on for 20 minutes,  2-3 times a day.  If directed, apply heat to the affected area as often as told by your health care provider. Use the heat source that your health care provider recommends, such as a moist heat pack or a heating pad. ? Place a towel between your skin and the heat source. ? Leave the heat on for 20-30 minutes. ? Remove the heat if your skin turns bright red. This is especially important if you are unable to feel pain, heat, or cold. You may have a greater risk of getting burned. Activity   Do not sit or rest in bed for long periods of time.  Try to stay as active as possible. Ask your health care provider what type of exercise or activity is best for you.  Avoid activities  that make your pain worse, such as bending and lifting.  Do not lift anything that is heavier than 10 lb (4.5 kg), or the limit that you are told, until your health care provider says that it is safe.  Practice using proper technique when lifting items. Proper lifting technique involves bending your knees and rising up.  Do strength and range-of-motion exercises only as told by your health care provider or physical therapist. General instructions  Take over-the-counter and prescription medicines only as told by your health care provider.  Pay attention to any changes in your symptoms.  Keep all follow-up visits as told by your health care provider. This is important. ? Your health care provider may send you to a physical therapist to help with this pain. Contact a health care provider if:  Your pain and other symptoms get worse.  Your pain medicine is not helping.  Your pain has not improved after a few weeks of home care.  You have a fever. Get help right away if:  You have severe pain, weakness, or numbness.  You have difficulty with bladder or bowel control. Summary  Radicular pain is a type of pain that spreads from your back or neck along a spinal nerve.  When you have radicular pain, you may also  have weakness, numbness, or tingling in the area of your body that is supplied by the nerve.  The pain may feel sharp or burning.  Radicular pain may be treated with ice, heat, medicines, or physical therapy. This information is not intended to replace advice given to you by your health care provider. Make sure you discuss any questions you have with your health care provider. Document Revised: 07/27/2017 Document Reviewed: 07/27/2017 Elsevier Patient Education  [place].

## 2019-05-09 NOTE — Progress Notes (Signed)
Subjective:    CC: L leg pain  I, Evan Myers, LAT, ATC, am serving as scribe for Dr. Lynne Myers.  HPI: Pt is a 79 y/o male presenting w/ c/o L leg pain running from his L lateral hip to his ankle x several weeks.  He rates his pain as brief, intermittent and severe at it's worst and describes his pain as aching in general but can be sharp with L hip aBd and w/ L LE weight bearing.  He is also having an aching pain in his L lateral lower leg that radiates into his L anterior ankle and foot.  He also has a hx of varicose veins in the L LE and restless leg syndrome.  No leg swelling  Radiating pain: Yes from L lateral hip to L ankle Back pain: chronic LE numbness/tingling: No Aggravating factors: transitioning from sit-to-stand; initial weight bearing on the L LE; active L hip aBd Treatments tried: Aleve last night  Diagnostic testing: None  Pertinent review of Systems: No fevers or chills  Relevant historical information: History of chronic intermittent back pain   Objective:    Vitals:   05/09/19 1013  BP: 122/60  Pulse: (!) 59  SpO2: 97%   General: Well Developed, well nourished, and in no acute distress.   MSK:  L-spine nontender to midline. Decreased motion to rotation lateral flexion extension normal flexion. Mildly positive left-sided slump test. Leg length strength intact bilaterally except for noted below. Reflexes diminished but equal bilateral lower extremities. Sensation is intact throughout.  Left hip normal-appearing Normal motion. Tender palpation greater trochanter. Hip abduction strength diminished 4/5. External rotation strength slightly diminished 4+/5. Internal rotation and adduction strength intact. Radiating pain below knee not reproduced with palpation over piriformis and sciatic nerve in posterior hip  Lab and Radiology Results  X-ray images L-spine obtained today personally and independently reviewed Mild DDD and facet DJD L5-S1.   Normal alignment no fractures  Await formal radiology over read   Impression and Recommendations:    Assessment and Plan: 78 y.o. male with  Left lateral hip and leg pain.  Symptoms ongoing for about 2 months.  Pain I believe is multifactorial.  I do believe he has a component of hip abductor tendinopathy or trochanteric bursitis.  However I also think he has lumbar radiculopathy at L4 and possibly L5. Plan for course of oral prednisone and trial of gabapentin.  Home exercise teaching provided in clinic today by ATC.  Work on hip abduction strengthening and core stabilizing.  Recommended physical therapy patient will consider it and let me know if he like to proceed.  Recheck back in a month if not better.  Precautions reviewed return sooner if needed..   [id number]; 15 additional minutes spent for Therapeutic exercises as stated in above notes.  This included exercises focusing on stretching, strengthening, with significant focus on eccentric aspects.   Long term goals include an improvement in range of motion, strength, endurance as well as avoiding reinjury. Patient's frequency would include in 1-2 times a day, 3-5 times a week for a duration of 6-12 weeks.  Proper technique shown and discussed handout in great detail with ATC.  All questions were discussed and answered.   PDMP not reviewed this encounter. Orders Placed This Encounter  Procedures  . DG Lumbar Spine Complete    Standing Status:   Future    Standing Expiration Date:   07/08/2020    Order Specific Question:   Reason for Exam (  SYMPTOM  OR DIAGNOSIS REQUIRED)    Answer:   back pain and left leg radiculopathy    Order Specific Question:   Preferred imaging location?    Answer:   [facility]    Order Specific Question:   Radiology Contrast Protocol - do NOT remove file path    Answer:   [email or web address]   Meds ordered this encounter  Medications  . predniSONE (DELTASONE) 50 MG  tablet    Sig: Take 1 tablet (50 mg total) by mouth daily.    Dispense:  5 tablet    Refill:  0  . gabapentin (NEURONTIN) 300 MG capsule    Sig: Take 1 capsule (300 mg total) by mouth 3 (three) times daily as needed (nerve pain).    Dispense:  90 capsule    Refill:  1    Discussed warning signs or symptoms. Please see discharge instructions. Patient expresses understanding.   The above documentation has been reviewed and is accurate and complete Evan Myers

## 2019-05-10 NOTE — Progress Notes (Signed)
Xray lumbar spine shows mild arthritis.

## 2019-05-11 DIAGNOSIS — L2084 Intrinsic (allergic) eczema: Secondary | ICD-10-CM | POA: Diagnosis not present

## 2019-05-11 DIAGNOSIS — D485 Neoplasm of uncertain behavior of skin: Secondary | ICD-10-CM | POA: Diagnosis not present

## 2019-05-11 DIAGNOSIS — L820 Inflamed seborrheic keratosis: Secondary | ICD-10-CM | POA: Diagnosis not present

## 2019-05-25 ENCOUNTER — Other Ambulatory Visit: Payer: Self-pay | Admitting: *Deleted

## 2019-05-25 DIAGNOSIS — I83893 Varicose veins of bilateral lower extremities with other complications: Secondary | ICD-10-CM

## 2019-05-31 ENCOUNTER — Other Ambulatory Visit: Payer: Self-pay

## 2019-05-31 ENCOUNTER — Encounter: Payer: Self-pay | Admitting: Vascular Surgery

## 2019-05-31 ENCOUNTER — Ambulatory Visit (HOSPITAL_COMMUNITY)
Admission: RE | Admit: 2019-05-31 | Discharge: 2019-05-31 | Disposition: A | Discharge status: Home / Self Care | Payer: Medicare Other | Source: Ambulatory Visit | Attending: Vascular Surgery | Admitting: Vascular Surgery

## 2019-05-31 ENCOUNTER — Ambulatory Visit (INDEPENDENT_AMBULATORY_CARE_PROVIDER_SITE_OTHER): Payer: Medicare Other | Admitting: Vascular Surgery

## 2019-05-31 VITALS — BP 133/78 | HR 50 | Temp 97.0°F | Resp 18 | Ht 70.0 in | Wt 174.9 lb

## 2019-05-31 DIAGNOSIS — I83893 Varicose veins of bilateral lower extremities with other complications: Secondary | ICD-10-CM | POA: Insufficient documentation

## 2019-05-31 DIAGNOSIS — I83812 Varicose veins of left lower extremities with pain: Secondary | ICD-10-CM | POA: Diagnosis not present

## 2019-05-31 NOTE — Progress Notes (Signed)
Referring Physician: Dr. Shelia Media  Patient name: Evan Myers MRN: [id number] DOB: 26-Dec-1940 Sex: male  REASON FOR CONSULT: Varicose veins left leg  HPI: Evan Myers is a 79 y.o. male, who has about a 74-month history of left lateral hip and left lateral calf pain.  This is fairly continuous in nature.  He does not really describe any swelling in the lower extremities.  He does not have any ulcerations.  He does not really describe heaviness fullness and aching that progresses as the day moves along.  His pain is similar whether it is morning or evening.  He does have a family history of varicose veins in his sister.  He has no history of DVT.  Other medical problems include hyperlipidemia and coronary artery disease.  These are both stable.  Past Medical History:  Diagnosis Date  . Actinic keratosis   . Adenomatous polyp of colon   . Allergic rhinitis   . Aortic stenosis   . GERD (gastroesophageal reflux disease)   . Heart murmur   . Hemorrhoids   . HOH (hard of hearing)   . 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 Hyperlipidemia   . Mild CAD    via cath in 2008 (per office notes)  . Polymyalgia rheumatica ([place])   . Restless leg syndrome   . Tinnitus   . Urticaria   . Varicose veins of left lower extremity    Past Surgical History:  Procedure Laterality Date  . CARDIAC CATHETERIZATION    . NM MYOCAR PERF WALL MOTION  2011   bruce myoview - normal perfusion, EF 58%  . TONSILLECTOMY  1949  . TRANSTHORACIC ECHOCARDIOGRAM  2011   EF=>55%; mild mitral annular calcification, mild MR; mild TR, normal RVSP; mild calcification of AV leaflets and mild valvular AS; aortic root sclerosis/calcification  . VASECTOMY  1975    Family History  Problem Relation Age of Onset  . Hypertension Mother   . Hyperlipidemia Mother   . Heart failure Mother   . Skin cancer Mother   . Heart attack Father 69  . Hypertension Father   . COPD Father   . Emphysema Father   . Stroke Father   .  Hypotension Maternal Grandmother   . Diabetes Paternal Grandmother   . Nephrolithiasis Brother        half - sibling  . Hypertension Brother        half - sibling  . Hyperlipidemia Brother        half - sibling  . Hypertension Sister   . SIDS Brother        9 months, half - sibling    SOCIAL HISTORY: Social History   Socioeconomic History  . Marital status: Married    Spouse name: Not on file  . Number of children: 2  . Years of education: bachelor's  . Highest education level: Not on file  Occupational History  . Occupation: electronics/engineering  Tobacco Use  . Smoking status: Former Smoker    Packs/day: 1.00    Years: 6.00    Pack years: 6.00    Types: Cigarettes    Quit date: 09/08/1968    Years since quitting: 50.7  . Smokeless tobacco: Never Used  Substance and Sexual Activity  . Alcohol use: No  . Drug use: No  . Sexual activity: Not on file  Other Topics Concern  . Not on file  Social History Narrative  . Not on file   Social Determinants of Health  Financial Resource Strain:   . Difficulty of Paying Living Expenses:   Food Insecurity:   . Worried About Charity fundraiser in the Last Year:   . Arboriculturist in the Last Year:   Transportation Needs:   . Film/video editor (Medical):   Marland Kitchen Lack of Transportation (Non-Medical):   Physical Activity:   . Days of Exercise per Week:   . Minutes of Exercise per Session:   Stress:   . Feeling of Stress :   Social Connections:   . Frequency of Communication with Friends and Family:   . Frequency of Social Gatherings with Friends and Family:   . Attends Religious Services:   . Active Member of Clubs or Organizations:   . Attends Archivist Meetings:   Marland Kitchen Marital Status:   Intimate Partner Violence:   . Fear of Current or Ex-Partner:   . Emotionally Abused:   Marland Kitchen Physically Abused:   . Sexually Abused:     Allergies  Allergen Reactions  . Brinzolamide-Brimonidine Other (See Comments)      Pt states eye drops highly Irritates his eyes.  . Doxycycline Other (See Comments)    constipation  . Sulfamethoxazole-Trimethoprim Other (See Comments)    Pt states bactrim elevates his creatinine    Current Outpatient Medications  Medication Sig Dispense Refill  . alfuzosin (UROXATRAL) 10 MG 24 hr tablet Take 10 mg by mouth daily.     Marland Kitchen aspirin 81 MG tablet Take 81 mg by mouth daily.    . Carboxymethylcellulose Sodium (THERATEARS) 0.25 % SOLN Apply to eye as needed.    . cetirizine (ZYRTEC) 10 MG tablet Take 10 mg by mouth daily.    Marland Kitchen gabapentin (NEURONTIN) 300 MG capsule Take 1 capsule (300 mg total) by mouth 3 (three) times daily as needed (nerve pain). 90 capsule 1  . levothyroxine (SYNTHROID) 50 MCG tablet Take 50 mcg by mouth daily before breakfast.    . pantoprazole (PROTONIX) 40 MG tablet Take 40 mg by mouth daily.    . Pitavastatin Calcium (LIVALO) 2 MG TABS Take 1 mg by mouth daily.    . Bromelain 250 MG CAPS Take 250 mg by mouth 2 (two) times daily after a meal.    . Multiple Vitamins-Minerals (CENTRUM SILVER PO) Take by mouth daily.    Marland Kitchen omeprazole (PRILOSEC) 40 MG capsule Take 40 mg by mouth daily.    . predniSONE (DELTASONE) 50 MG tablet Take 1 tablet (50 mg total) by mouth daily. 5 tablet 0   No current facility-administered medications for this visit.    ROS:   General:  No weight loss, Fever, chills  HEENT: No recent headaches, no nasal bleeding, no visual changes, no sore throat  Neurologic: No dizziness, blackouts, seizures. No recent symptoms of stroke or mini- stroke. No recent episodes of slurred speech, or temporary blindness.  Cardiac: No recent episodes of chest pain/pressure, no shortness of breath at rest.  No shortness of breath with exertion.  Denies history of atrial fibrillation or irregular heartbeat  Vascular: No history of rest pain in feet.  No history of claudication.  No history of non-healing ulcer, No history of DVT   Pulmonary: No home  oxygen, no productive cough, no hemoptysis,  No asthma or wheezing  Musculoskeletal:  [ ]  Arthritis, [ ]  Low back pain,  [ ]  Joint pain  Hematologic:No history of hypercoagulable state.  No history of easy bleeding.  No history of anemia  Gastrointestinal: No  hematochezia or melena,  No gastroesophageal reflux, no trouble swallowing  Urinary: [ ]  chronic Kidney disease, [ ]  on HD - [ ]  MWF or [ ]  TTHS, [ ]  Burning with urination, [ ]  Frequent urination, [ ]  Difficulty urinating;   Skin: No rashes  Psychological: No history of anxiety,  No history of depression   Physical Examination  Vitals:   05/31/19 1520  BP: 133/78  Pulse: (!) 50  Resp: 18  Temp: (!) 97 F (36.1 C)  TempSrc: Temporal  SpO2: 100%  Weight: 174 lb 14.4 oz (79.3 kg)  Height: 5\' 10"  (1.778 m)    Body mass index is 25.1 kg/m.  General:  Alert and oriented, no acute distress HEENT: Normal Neck: No bruit or JVD Cardiac: Regular Rate and Rhythm Abdomen: Soft, non-tender, non-distended, no mass Skin: No rash, multiple varicosities 4 to 5 mm over the left medial calf especially around the left medial knee. Extremity Pulses:  2+ radial, brachial, femoral, dorsalis pedis, posterior tibial pulses bilaterally Musculoskeletal: No deformity or edema  Neurologic: Upper and lower extremity motor 5/5 and symmetric  DATA:  Patient had a venous reflux exam today which I reviewed and interpreted.  This showed diffuse reflux in the left greater saphenous vein from the saphenofemoral junction down to the proximal calf with a 4 to 6 mm diameter vein.  ASSESSMENT: Patient does have reflux in the left greater saphenous vein with vein dilation up to 6 mm however I do not believe his pain symptoms over the lateral aspect of his hip and lateral calf which are fairly continuous in nature represent the typical pain seen with varicose veins.  I believe this is more musculoskeletal in nature.   PLAN: Patient was given a  prescription for 20-30 potentially moving up to 30 to 40 mm compression stockings for symptomatic relief.  He will follow-up with [place] on an as-needed basis if the varicosities in his left leg become worse over time or if he develops symptoms more consistent with venous pathology other than the lateral hip pain that he is currently experiencing.   Ruta Hinds, MD [facility] Office: [phone] Pager: [phone]

## 2019-06-07 ENCOUNTER — Ambulatory Visit (INDEPENDENT_AMBULATORY_CARE_PROVIDER_SITE_OTHER): Payer: Medicare Other

## 2019-06-07 ENCOUNTER — Encounter: Payer: Self-pay | Admitting: Family Medicine

## 2019-06-07 ENCOUNTER — Ambulatory Visit (INDEPENDENT_AMBULATORY_CARE_PROVIDER_SITE_OTHER): Payer: Medicare Other | Admitting: Family Medicine

## 2019-06-07 ENCOUNTER — Other Ambulatory Visit: Payer: Self-pay

## 2019-06-07 VITALS — BP 120/82 | HR 64 | Ht 70.0 in | Wt 176.8 lb

## 2019-06-07 DIAGNOSIS — R1032 Left lower quadrant pain: Secondary | ICD-10-CM | POA: Diagnosis not present

## 2019-06-07 DIAGNOSIS — M7062 Trochanteric bursitis, left hip: Secondary | ICD-10-CM

## 2019-06-07 DIAGNOSIS — M5416 Radiculopathy, lumbar region: Secondary | ICD-10-CM | POA: Diagnosis not present

## 2019-06-07 DIAGNOSIS — M25552 Pain in left hip: Secondary | ICD-10-CM | POA: Diagnosis not present

## 2019-06-07 NOTE — Patient Instructions (Addendum)
Thank you for coming in today. Get xray today.  Plan for MRI.  You should hear about scheduling soon.  Let me know if you do not hear anything.  Recheck after MRI.    Radicular Pain Radicular pain is a type of pain that spreads from your back or neck along a spinal nerve. Spinal nerves are nerves that leave the spinal cord and go to the muscles. Radicular pain is sometimes called radiculopathy, radiculitis, or a pinched nerve. When you have this type of pain, you may also have weakness, numbness, or tingling in the area of your body that is supplied by the nerve. The pain may feel sharp and burning. Depending on which spinal nerve is affected, the pain may occur in the:  Neck area (cervical radicular pain). You may also feel pain, numbness, weakness, or tingling in the arms.  Mid-spine area (thoracic radicular pain). You would feel this pain in the back and chest. This type is rare.  Lower back area (lumbar radicular pain). You would feel this pain as low back pain. You may feel pain, numbness, weakness, or tingling in the buttocks or legs. Sciatica is a type of lumbar radicular pain that shoots down the back of the leg. Radicular pain occurs when one of the spinal nerves becomes irritated or squeezed (compressed). It is often caused by something pushing on a spinal nerve, such as one of the bones of the spine (vertebrae) or one of the round cushions between vertebrae (intervertebral disks). This can result from:  An injury.  Wear and tear or aging of a disk.  The growth of a bone spur that pushes on the nerve. Radicular pain often goes away when you follow instructions from your health care provider for relieving pain at home. Follow these instructions at home: Managing pain      If directed, put ice on the affected area: ? Put ice in a plastic bag. ? Place a towel between your skin and the bag. ? Leave the ice on for 20 minutes, 2-3 times a day.  If directed, apply heat to the  affected area as often as told by your health care provider. Use the heat source that your health care provider recommends, such as a moist heat pack or a heating pad. ? Place a towel between your skin and the heat source. ? Leave the heat on for 20-30 minutes. ? Remove the heat if your skin turns bright red. This is especially important if you are unable to feel pain, heat, or cold. You may have a greater risk of getting burned. Activity   Do not sit or rest in bed for long periods of time.  Try to stay as active as possible. Ask your health care provider what type of exercise or activity is best for you.  Avoid activities that make your pain worse, such as bending and lifting.  Do not lift anything that is heavier than 10 lb (4.5 kg), or the limit that you are told, until your health care provider says that it is safe.  Practice using proper technique when lifting items. Proper lifting technique involves bending your knees and rising up.  Do strength and range-of-motion exercises only as told by your health care provider or physical therapist. General instructions  Take over-the-counter and prescription medicines only as told by your health care provider.  Pay attention to any changes in your symptoms.  Keep all follow-up visits as told by your health care provider. This is important. ?  Your health care provider may send you to a physical therapist to help with this pain. Contact a health care provider if:  Your pain and other symptoms get worse.  Your pain medicine is not helping.  Your pain has not improved after a few weeks of home care.  You have a fever. Get help right away if:  You have severe pain, weakness, or numbness.  You have difficulty with bladder or bowel control. Summary  Radicular pain is a type of pain that spreads from your back or neck along a spinal nerve.  When you have radicular pain, you may also have weakness, numbness, or tingling in the area of  your body that is supplied by the nerve.  The pain may feel sharp or burning.  Radicular pain may be treated with ice, heat, medicines, or physical therapy. This information is not intended to replace advice given to you by your health care provider. Make sure you discuss any questions you have with your health care provider. Document Revised: 07/27/2017 Document Reviewed: 07/27/2017 Elsevier Patient Education  [place].

## 2019-06-07 NOTE — Progress Notes (Signed)
I, Evan Myers, LAT, ATC, am serving as scribe for Dr. Lynne Leader.  Evan Myers is a 79 y.o. male who presents to [place] at [facility] today for f/u of L leg pain radiating from the lateral hip to the ankle.  Pt was last seen by Dr. Georgina Snell on 05/09/19 and was prescribed prednisone 50mg  x 5 days and Gabapentin 300mg .  He was also provided w/ a HEP focusing on hip aBd strengthening and h/s stretching.  Since his last visit, pt reports that his L lower leg pain is worse from his lateral lower leg around to the anterior lower leg and across his L ankle to his L anterior foot.  He notes that his worst pain is when he first stands up from resting or sitting.  He denies any sensory changes or changes in strength in his L lower leg or foot.  He reports low back pain when he goes to bed and is only able to sleep about 4-5 hours due to back pain.  He reports improvement in his lower leg pain w/ the prednisone but the pain returned after he finished his prednisone.  Of note, pt has been seen by vascular surgery concerning varicose veins in his legs but vascular surgery does not thing his current L lower leg c/o are associated w/ his varicose veins.  He additionally notes pain in the anterior aspect of his left hip when he stands up from a seated position.  Diagnostic testing: L-spine XR- 05/09/19   Pertinent review of systems: No fevers or chills  Relevant historical information: Aortic valve stenosis, hyperlipidemia.   Exam:  BP 120/82 (BP Location: Right Arm, Patient Position: Sitting, Cuff Size: Normal)   Pulse 64   Ht 5\' 10"  (1.778 m)   Wt 176 lb 12.8 oz (80.2 kg)   SpO2 98%   BMI 25.37 kg/m  General: Well Developed, well nourished, and in no acute distress.   MSK: L-spine Normal-appearing Nontender. Normal motion to flexion rotation and lateral flexion.  Limited extension. Lower extremity strength intact to foot dorsiflexion great toe dorsiflexion plantarflexion  knee flexion extension. Reflexes and sensation intact and equal bilateral lower extremities.      Lab and Radiology Results EXAM: LUMBAR SPINE - COMPLETE 4+ VIEW  COMPARISON:  None.  FINDINGS: Sagittal alignment is normal. Vertebral body heights are maintained. Mild degenerative changes at L1-L2 and L2-L3. Mild facet degenerative change of the lumbosacral spine.  IMPRESSION: Mild degenerative changes.  No acute osseous abnormality.   Electronically Signed   By: Donavan Foil M.D.   On: 05/09/2019 20:43 I, Lynne Leader, personally (independently) visualized and performed the interpretation of the images attached in this note.   X-ray images left hip obtained today personally and independently reviewed No acute fractures.  No severe or significant DJD. Await formal radiology review   Assessment and Plan: 79 y.o. male with left lower leg pain: Distribution of pain consistent with L5 dermatomal pattern very likely due to radiculopathy.  Patient does have some degenerative changes in L-spine at L5-S1 per my interpretation.  Plan for MRI to further characterize this.  His pain has progressed since April indicating worsening progressive neurological symptoms.  Recheck after MRI.  MRI for diagnosis and for potential injection planning.  Anterior hip pain: Etiology less clear for this.  He does have degenerative changes at L1-L2 and L2-L3.  This region of pain pain along the anterior aspect hip could be L1 or L2 radiculopathy.  Again  MRI would help characterize this.  X-ray per my interpretation today did not show much degenerative change to indicate hip as cause of pain.  Lateral hip pain: Trochanteric bursitis.  Somewhat better since last visit with home exercise program.  Will consider physical therapy following MRI if indicated.Marland Kitchen   PDMP not reviewed this encounter. Orders Placed This Encounter  Procedures  . MR Lumbar Spine Wo Contrast    [employer] A[employer] MCR Advantage/Tricare for  life Epic order   169lbs/5'9/No needs/No Claus/No to all covid/No metal in eyes or removed by a physician/No implants/No Glucose monitor/No defribilators/No pacemaker/No stents/No hx of bullets or BB wounds/No hardware/No hx of Lumbar sx/No hx of brain, heart, or ear sx/Cat onlyBCR and pt/ORDER CHECKED/06/07/19/4:31pm  Patient aware to come alone & wear mask    Standing Status:   Future    Standing Expiration Date:   08/06/2020    Order Specific Question:   What is the patient's sedation requirement?    Answer:   No Sedation    Order Specific Question:   Does the patient have a pacemaker or implanted devices?    Answer:   No    Order Specific Question:   Preferred imaging location?    Answer:   [facility] (table limit-550lbs)    Order Specific Question:   Radiology Contrast Protocol - do NOT remove file path    Answer:   \\[email or web address]  . DG HIP UNILAT WITH PELVIS 2-3 VIEWS LEFT    Standing Status:   Future    Number of Occurrences:   1    Standing Expiration Date:   08/06/2020    Order Specific Question:   Reason for Exam (SYMPTOM  OR DIAGNOSIS REQUIRED)    Answer:   eval left hip pain    Order Specific Question:   Preferred imaging location?    Answer:   [facility]    Order Specific Question:   Radiology Contrast Protocol - do NOT remove file path    Answer:   \\[email or web address]\[email or web address]   No orders of the defined types were placed in this encounter.    Discussed warning signs or symptoms. Please see discharge instructions. Patient expresses understanding.   The above documentation has been reviewed and is accurate and complete Lynne Leader

## 2019-06-08 NOTE — Progress Notes (Signed)
X-ray left hip looks normal to radiology

## 2019-06-21 ENCOUNTER — Telehealth: Payer: Self-pay | Admitting: Family Medicine

## 2019-06-21 ENCOUNTER — Other Ambulatory Visit: Payer: Self-pay

## 2019-06-21 ENCOUNTER — Ambulatory Visit
Admission: RE | Admit: 2019-06-21 | Discharge: 2019-06-21 | Disposition: A | Discharge status: Home / Self Care | Payer: Medicare Other | Source: Ambulatory Visit | Attending: Family Medicine | Admitting: Family Medicine

## 2019-06-21 DIAGNOSIS — M545 Low back pain: Secondary | ICD-10-CM | POA: Diagnosis not present

## 2019-06-21 DIAGNOSIS — M5416 Radiculopathy, lumbar region: Secondary | ICD-10-CM

## 2019-06-21 NOTE — Progress Notes (Signed)
Lumbar MRI shows bulging disc compressing L4 nerve root on the left which certainly could cause your pain down your leg. I have ordered an epidural steroid injection to [facility] imaging. He will need to stop the aspirin for at least 1 week prior to the injection and for 3 days following the injection. Please stop it now. Please call [place] imaging to schedule the injection. Phone number is [phone]. Either follow-up with me soon to discuss the MRI and injection or follow-up with me after injection to review MRI results and see how you are feeling.

## 2019-06-21 NOTE — Telephone Encounter (Signed)
Epidural steroid injection ordered based on MRI

## 2019-06-24 ENCOUNTER — Other Ambulatory Visit: Payer: Medicare Other

## 2019-06-29 ENCOUNTER — Other Ambulatory Visit: Payer: Self-pay

## 2019-06-29 ENCOUNTER — Ambulatory Visit
Admission: RE | Admit: 2019-06-29 | Discharge: 2019-06-29 | Disposition: A | Discharge status: Home / Self Care | Payer: Medicare Other | Source: Ambulatory Visit | Attending: Family Medicine | Admitting: Family Medicine

## 2019-06-29 DIAGNOSIS — M5416 Radiculopathy, lumbar region: Secondary | ICD-10-CM

## 2019-06-29 MED ORDER — IOPAMIDOL (ISOVUE-M 200) INJECTION 41%: 1.0000 mL | Freq: Once | INTRAMUSCULAR | Status: DC

## 2019-06-29 MED ORDER — METHYLPREDNISOLONE ACETATE 40 MG/ML INJ SUSP (RADIOLOG: 120.0000 mg | Freq: Once | INTRAMUSCULAR | Status: DC

## 2019-07-06 ENCOUNTER — Other Ambulatory Visit: Payer: Medicare Other

## 2019-07-14 ENCOUNTER — Ambulatory Visit (INDEPENDENT_AMBULATORY_CARE_PROVIDER_SITE_OTHER): Payer: Medicare Other | Admitting: Family Medicine

## 2019-07-14 ENCOUNTER — Encounter: Payer: Self-pay | Admitting: Family Medicine

## 2019-07-14 ENCOUNTER — Other Ambulatory Visit: Payer: Self-pay

## 2019-07-14 VITALS — BP 104/76 | HR 50 | Ht 70.0 in | Wt 174.8 lb

## 2019-07-14 DIAGNOSIS — M5416 Radiculopathy, lumbar region: Secondary | ICD-10-CM | POA: Diagnosis not present

## 2019-07-14 NOTE — Patient Instructions (Signed)
Thank you for coming in today.  Plan for watchful waiting.  Next step if needed is the injection we talked about.  Let me know if your symptoms worsen enough to make the epidural steroid injection worthwhile.   Pay attention especially to weakness to foot or ankle dorseflexion (making your foot go up).   Keep me updated.   Recheck as needed.

## 2019-07-14 NOTE — Progress Notes (Signed)
I, [name], LAT, ATC, am serving as scribe for Dr. Lynne Leader.  Evan Myers is a 79 y.o. male who presents to [place] at [facility] today for f/u of his low back and L leg pain and to review his L-spine MRI that he had on 06/21/19.  He was last seen by Dr. Georgina Snell on 06/07/19 and noted con't L leg pain from his hip to his ankle w/ no changes noted in sensation or strength.  He has tried prednisone in the past which did help w/ his symptoms and has been taking Gabapentin.  Since his last visit, pt reports that he's been feeling a bit better than previously.  He states that he will intermittently have pain when walking on hard surfaces ie shopping at [facility]  When he gets his pain, he con't to have pain in his L low back, lateral hip, groin and L anterior lower leg across the ant ankle and into his L foot.  He reports some intermittent in his L ant lower leg which is new.  He states that he went to his appt for his spinal injection but did not get the injection due to how low his symptoms were.  Diagnostic testing: L-spine MRI- 06/21/19; L hip XR- 06/07/19; L-spine XR- 05/09/19   Pertinent review of systems: No fevers or chills  Relevant historical information: Hyperlipidemia, aortic valve stenosis   Exam:  BP 104/76 (BP Location: Right Arm, Patient Position: Sitting, Cuff Size: Normal)   Pulse (!) 50   Ht 5\' 10"  (1.778 m)   Wt 174 lb 12.8 oz (79.3 kg)   SpO2 96%   BMI 25.08 kg/m  General: Well Developed, well nourished, and in no acute distress.   MSK: L-spine normal motion. Left lower extremity strength is intact especially to foot dorsiflexion.    Lab and Radiology Results  EXAM: MRI LUMBAR SPINE WITHOUT CONTRAST  TECHNIQUE: Multiplanar, multisequence MR imaging of the lumbar spine was performed. No intravenous contrast was administered.  COMPARISON:  Lumbar radiography 05/09/2019  FINDINGS: Segmentation:  Standard.  Alignment:   Physiologic.  Vertebrae:  No fracture, evidence of discitis, or bone lesion.  Conus medullaris and cauda equina: Conus extends to the L1 level. Conus and cauda equina appear normal.  Paraspinal and other soft tissues: Small right renal cystic intensity.  Disc levels:  T12- L1: Unremarkable.  L1-L2: Mild disc narrowing and bulging.  L2-L3: Mild disc narrowing and bulging  L3-L4: Mild disc narrowing and bulging  L4-L5: Left foraminal extrusion obliterating the perineural fat. There is underlying disc bulging and mild endplate spurring.  L5-S1:Mild disc bulging and facet spurring.  IMPRESSION: 1. 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 2. Otherwise mild for age degenerative changes without neural compression.   Electronically Signed   By: Monte Fantasia M.D.   On: 06/21/2019 11:11  I, Lynne Leader, personally (independently) visualized and performed the interpretation of the images attached in this note.    Assessment and Plan: 79 y.o. 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  Fortunately symptoms spontaneously improved with time and exercises for hip abduction strengthening which is a bit unusual.  Regardless he is feeling a lot better and no longer taking gabapentin.  He does have a little bit of intermittent pain and a little bit of intermittent numbness and tingling but no weakness or significant pain.  Discussed that at this point we will proceed with watchful waiting however if symptoms worsen or return could proceed  with epidural steroid injection.  Additionally okay to transition to as needed gabapentin.  Recheck back with me as needed.   Total encounter time 20 minutes including charting time date of service. MRI results and plan  Discussed warning signs or symptoms. Please see discharge instructions. Patient expresses understanding.   The above documentation has been reviewed and is accurate and complete Lynne Leader,  M.D.

## 2019-08-18 DIAGNOSIS — Z961 Presence of intraocular lens: Secondary | ICD-10-CM | POA: Diagnosis not present

## 2019-08-18 DIAGNOSIS — H40023 Open angle with borderline findings, high risk, bilateral: Secondary | ICD-10-CM | POA: Diagnosis not present

## 2019-08-18 DIAGNOSIS — H21231 Degeneration of iris (pigmentary), right eye: Secondary | ICD-10-CM | POA: Diagnosis not present

## 2019-10-09 DIAGNOSIS — N138 Other obstructive and reflux uropathy: Secondary | ICD-10-CM | POA: Diagnosis not present

## 2019-10-12 DIAGNOSIS — E039 Hypothyroidism, unspecified: Secondary | ICD-10-CM | POA: Diagnosis not present

## 2019-10-12 DIAGNOSIS — E7800 Pure hypercholesterolemia, unspecified: Secondary | ICD-10-CM | POA: Diagnosis not present

## 2019-10-13 DIAGNOSIS — Z23 Encounter for immunization: Secondary | ICD-10-CM | POA: Diagnosis not present

## 2019-10-19 DIAGNOSIS — Z0000 Encounter for general adult medical examination without abnormal findings: Secondary | ICD-10-CM | POA: Diagnosis not present

## 2019-10-19 DIAGNOSIS — E7800 Pure hypercholesterolemia, unspecified: Secondary | ICD-10-CM | POA: Diagnosis not present

## 2019-10-26 ENCOUNTER — Encounter: Payer: Self-pay | Admitting: Cardiovascular Disease

## 2019-10-26 ENCOUNTER — Other Ambulatory Visit: Payer: Self-pay

## 2019-10-26 ENCOUNTER — Ambulatory Visit (INDEPENDENT_AMBULATORY_CARE_PROVIDER_SITE_OTHER): Payer: Medicare Other | Admitting: Cardiovascular Disease

## 2019-10-26 VITALS — BP 108/59 | HR 75 | Ht 69.5 in | Wt 176.0 lb

## 2019-10-26 DIAGNOSIS — I350 Nonrheumatic aortic (valve) stenosis: Secondary | ICD-10-CM

## 2019-10-26 DIAGNOSIS — I447 Left bundle-branch block, unspecified: Secondary | ICD-10-CM

## 2019-10-26 DIAGNOSIS — E7800 Pure hypercholesterolemia, unspecified: Secondary | ICD-10-CM | POA: Diagnosis not present

## 2019-10-26 NOTE — Patient Instructions (Signed)
Medication Instructions:  No changes *If you need a refill on your cardiac medications before your next appointment, please call your pharmacy*   Lab Work: None ordered If you have labs (blood work) drawn today and your tests are completely normal, you will receive your results only by: MyChart Message (if you have MyChart) OR A paper copy in the mail If you have any lab test that is abnormal or we need to change your treatment, we will call you to review the results.   Testing/Procedures: Your physician has requested that you have an echocardiogram. Echocardiography is a painless test that uses sound waves to create images of your heart. It provides your doctor with information about the size and shape of your heart and how well your heart's chambers and valves are working. You may receive an ultrasound enhancing agent through an IV if needed to better visualize your heart during the echo.This procedure takes approximately one hour. There are no restrictions for this procedure. This will take place at the [address], [address].    Follow-Up: At [facility], you and your health needs are our priority.  As part of our continuing mission to provide you with exceptional heart care, we have created designated Provider Care Teams.  These Care Teams include your primary Cardiologist (physician) and Advanced Practice Providers (APPs -  Physician Assistants and Nurse Practitioners) who all work together to provide you with the care you need, when you need it.  We recommend signing up for the patient portal called "MyChart".  Sign up information is provided on this After Visit Summary.  MyChart is used to connect with patients for Virtual Visits (Telemedicine).  Patients are able to view lab/test results, encounter notes, upcoming appointments, etc.  Non-urgent messages can be sent to your provider as well.   To learn more about what you can do with MyChart, go to [email or web address].     Your next appointment:   12 month(s)  The format for your next appointment:   In Person  Provider:   You may see Mihai Croitoru, MD or one of the following Advanced Practice Providers on your designated Care Team:   Hao Meng, PA-C Angela Duke, PA-C or  Krista Kroeger, PA-C   

## 2019-10-26 NOTE — Progress Notes (Signed)
Cardiology Consultation:   Patient ID: Evan Myers MRN: [id number]; DOB: 19-Apr-1940  Admit date: (Not on file) Date of Consult: 10/26/2019  Primary Care Provider: Deland Pretty, MD [facility] Cardiologist: Safi Culotta [place] Electrophysiologist:  None    Patient Profile:   Evan Myers is a 79 y.o. male with a hx of mild aortic stenosis, hyperlipidemia who is being seen today for the evaluation of new LBBB at the request of Dr. Deland Pretty.  History of Present Illness:   Evan Myers has not been seen in our office since 2015 when he was evaluated for a new murmur.  He was asymptomatic.  His echocardiogram showed very mild aortic stenosis.  He has treated hypercholesterolemia and takes levothyroxine supplementation.  He has a remote history of polymyalgia rheumatica, currently in remission.  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  He does not have diabetes mellitus, known CAD (normal coronary angiography 2006) or PAD, history of stroke, congestive heart failure, or history of cardiac arrhythmia.  He was seen in routine follow-up by Dr. Shelia Media and his electrocardiogram showed a new left bundle branch block.  Evan Myers continues to be very physically active, takes care of his own yard work and housework without any cardiovascular complaints.  He specifically denies dizziness, weakness or syncope.  He has not experienced chest pain or shortness of breath at rest or with activity.  He recently was able to climb 4 flights of stairs without taking any time to rest in between.  He did feel a little winded at the top, but did not have any chest discomfort or dizziness.  He specifically denies exertional angina/exertional dyspnea/exertional syncope.  He had an echocardiogram performed in September 2020.  LVEF was estimated at 65-70%, there was mild LVH (septum 1.2 cm)" diastolic dysfunction" (E:A 0.9, deceleration time 350 ms, but e' was 10 cm/s).  The aortic  valve was described as "mild to moderate aortic stenosis", but the measurements are quite confusing.  The peak gradient was only 14 mmHg, but the aortic valve area was calculated at 1.04 cm.   Past Medical History:  Diagnosis Date  . Actinic keratosis   . Adenomatous polyp of colon   . Allergic rhinitis   . Aortic stenosis   . GERD (gastroesophageal reflux disease)   . Heart murmur   . Hemorrhoids   . HOH (hard of hearing)   . 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 Hyperlipidemia   . Mild CAD    via cath in 2008 (per office notes)  . Polymyalgia rheumatica ([place])   . Restless leg syndrome   . Tinnitus   . Urticaria   . Varicose veins of left lower extremity     Past Surgical History:  Procedure Laterality Date  . CARDIAC CATHETERIZATION    . NM MYOCAR PERF WALL MOTION  2011   bruce myoview - normal perfusion, EF 58%  . TONSILLECTOMY  1949  . TRANSTHORACIC ECHOCARDIOGRAM  2011   EF=>55%; mild mitral annular calcification, mild MR; mild TR, normal RVSP; mild calcification of AV leaflets and mild valvular AS; aortic root sclerosis/calcification  . VASECTOMY  1975     Home Medications:  Prior to Admission medications   Medication Sig Start Date End Date Taking? Authorizing Provider  alfuzosin (UROXATRAL) 10 MG 24 hr tablet Take 10 mg by mouth daily.    Yes [provider]  aspirin 81 MG tablet Take 81 mg by mouth daily.   Yes [provider]  Carboxymethylcellulose Sodium (THERATEARS) 0.25 % SOLN Apply to eye as needed.   Yes [provider]  cetirizine (ZYRTEC) 10 MG tablet Take 10 mg by mouth daily.   Yes [provider]  gabapentin (NEURONTIN) 300 MG capsule Take 1 capsule (300 mg total) by mouth 3 (three) times daily as needed (nerve pain). 05/09/19  Yes Gregor Hams, MD  levothyroxine (SYNTHROID) 50 MCG tablet Take 50 mcg by mouth daily before breakfast.   Yes [provider]  pantoprazole (PROTONIX) 40 MG tablet Take 40 mg by mouth  daily.   Yes [provider]  Pitavastatin Calcium (LIVALO) 2 MG TABS Take 1 mg by mouth daily.   Yes [provider]    Allergies:    Allergies  Allergen Reactions  . Brinzolamide-Brimonidine Other (See Comments)    Pt states eye drops highly Irritates his eyes.  . Doxycycline Other (See Comments)    constipation  . Sulfamethoxazole-Trimethoprim Other (See Comments)    Pt states bactrim elevates his creatinine    Social History:   Social History   Socioeconomic History  . Marital status: Married    Spouse name: Not on file  . Number of children: 2  . Years of education: bachelor's  . Highest education level: Not on file  Occupational History  . Occupation: electronics/engineering  Tobacco Use  . Smoking status: Former Smoker    Packs/day: 1.00    Years: 6.00    Pack years: 6.00    Types: Cigarettes    Quit date: 09/08/1968    Years since quitting: 51.1  . Smokeless tobacco: Never Used  Substance and Sexual Activity  . Alcohol use: No  . Drug use: No  . Sexual activity: Not on file  Other Topics Concern  . Not on file  Social History Narrative  . Not on file   Social Determinants of Health   Financial Resource Strain:   . Difficulty of Paying Living Expenses: Not on file  Food Insecurity:   . Worried About Charity fundraiser in the Last Year: Not on file  . Ran Out of Food in the Last Year: Not on file  Transportation Needs:   . Lack of Transportation (Medical): Not on file  . Lack of Transportation (Non-Medical): Not on file  Physical Activity:   . Days of Exercise per Week: Not on file  . Minutes of Exercise per Session: Not on file  Stress:   . Feeling of Stress : Not on file  Social Connections:   . Frequency of Communication with Friends and Family: Not on file  . Frequency of Social Gatherings with Friends and Family: Not on file  . Attends Religious Services: Not on file  . Active Member of Clubs or Organizations: Not on file  .  Attends Archivist Meetings: Not on file  . Marital Status: Not on file  Intimate Partner Violence:   . Fear of Current or Ex-Partner: Not on file  . Emotionally Abused: Not on file  . Physically Abused: Not on file  . Sexually Abused: Not on file    Family History:    Family History  Problem Relation Age of Onset  . Hypertension Mother   . Hyperlipidemia Mother   . Heart failure Mother   . Skin cancer Mother   . Heart attack Father 10  . Hypertension Father   . COPD Father   . Emphysema Father   . Stroke Father   . Hypotension Maternal Grandmother   .  Diabetes Paternal Grandmother   . Nephrolithiasis Brother        half - sibling  . Hypertension Brother        half - sibling  . Hyperlipidemia Brother        half - sibling  . Hypertension Sister   . SIDS Brother        9 months, half - sibling     ROS:  Please see the history of present illness.  All other ROS reviewed and negative.     Physical Exam/Data:   Vitals:   10/26/19 1516  BP: (!) 108/59  Pulse: 75  SpO2: 96%  Weight: 176 lb (79.8 kg)  Height: 5' 9.5" (1.765 m)   @IOBRIEF @ Last 3 Weights 10/26/2019 07/14/2019 06/07/2019  Weight (lbs) 176 lb 174 lb 12.8 oz 176 lb 12.8 oz  Weight (kg) 79.833 kg 79.289 kg 80.196 kg     Body mass index is 25.62 kg/m.  General:  Well nourished, well developed, in no acute distress.  He looks very fit and looks substantially younger than his stated age 7: normal Lymph: no adenopathy Neck: no JVD Endocrine:  No thryomegaly Vascular: Faint bilateral carotid bruits, probably radiating from the chest; FA pulses 2+ bilaterally without bruits  Cardiac:  normal S1, distinct S2, paradoxically split; RRR; 3/6 mid peaking aortic ejection murmur, heard best at the right upper sternal border, no diastolic murmurs Lungs:  clear to auscultation bilaterally, no wheezing, rhonchi or rales  Abd: soft, nontender, no hepatomegaly  Ext: no edema Musculoskeletal:  No  deformities, BUE and BLE strength normal and equal Skin: warm and dry  Neuro:  CNs 2-12 intact, no focal abnormalities noted Psych:  Normal affect   EKG:  The EKG was personally reviewed and demonstrates: Normal sinus rhythm, left bundle branch block (QRS 154 ms, increased from 102 ms a year ago).   Relevant CV Studies: Reviewed echo findings from 10/19/2018 and the echo from 2015.  Laboratory Data:  Hemoglobin 13.9, glucose 92, creatinine 1.03, potassium 4.2, normal liver function tests, TSH 2.350 Total cholesterol 154, triglycerides 57, HDL 46, LDL 96  Radiology/Studies:  No results found.  Assessment and Plan:   1. AS: His physical exam suggest moderate aortic stenosis, since the murmur is now mid peaking.  The echo report from 2020 is a little confusing with discrepancy between the qualitative impression of the aortic valve stenosis versus the very low gradients.  I think it is reasonable to repeat his echocardiogram and get a repeat assessment of the aortic valve.  It is quite likely that he will eventually require aortic valve replacement, but this is only necessary once he develops symptoms (exertional angina/dyspnea/syncope) all of which he currently denies despite a very active lifestyle.  We briefly reviewed the options of SAVR versus TAVR.   2. LBBB: Tim feels well and has no symptoms that would suggest periods of higher grade AV block.  There is also no evidence that he has developed myocardial dysfunction.  I suspect the conduction abnormality is simply a function of advancing age.  It is reasonable to repeat his echocardiogram. 3. HLP: In the absence of known CAD/PAD, the current lipid parameters are all within the desirable range.    Signed, Sanda Klein, MD  10/26/2019 7:20 PM

## 2019-11-14 ENCOUNTER — Ambulatory Visit (HOSPITAL_COMMUNITY): Payer: Medicare Other | Attending: Cardiovascular Disease

## 2019-11-14 ENCOUNTER — Other Ambulatory Visit: Payer: Self-pay

## 2019-11-14 DIAGNOSIS — I350 Nonrheumatic aortic (valve) stenosis: Secondary | ICD-10-CM | POA: Diagnosis not present

## 2019-11-14 LAB — ECHOCARDIOGRAM COMPLETE
AR max vel: 1.29 cm2
AV Area VTI: 1.22 cm2
AV Area mean vel: 1.29 cm2
AV Mean grad: 16.7 mmHg
AV Peak grad: 28.4 mmHg
Ao pk vel: 2.67 m/s
Area-P 1/2: 2.24 cm2
P 1/2 time: 753 msec
S' Lateral: 3.1 cm

## 2019-11-21 DIAGNOSIS — K219 Gastro-esophageal reflux disease without esophagitis: Secondary | ICD-10-CM | POA: Diagnosis not present

## 2019-11-21 DIAGNOSIS — R490 Dysphonia: Secondary | ICD-10-CM | POA: Diagnosis not present

## 2020-01-30 DIAGNOSIS — R6889 Other general symptoms and signs: Secondary | ICD-10-CM | POA: Diagnosis not present

## 2020-02-20 DIAGNOSIS — H21231 Degeneration of iris (pigmentary), right eye: Secondary | ICD-10-CM | POA: Diagnosis not present

## 2020-02-20 DIAGNOSIS — H40023 Open angle with borderline findings, high risk, bilateral: Secondary | ICD-10-CM | POA: Diagnosis not present

## 2020-02-24 DIAGNOSIS — E7800 Pure hypercholesterolemia, unspecified: Secondary | ICD-10-CM | POA: Diagnosis not present

## 2020-02-24 DIAGNOSIS — K219 Gastro-esophageal reflux disease without esophagitis: Secondary | ICD-10-CM | POA: Diagnosis not present

## 2020-02-24 DIAGNOSIS — E039 Hypothyroidism, unspecified: Secondary | ICD-10-CM | POA: Diagnosis not present

## 2020-04-25 DIAGNOSIS — E7800 Pure hypercholesterolemia, unspecified: Secondary | ICD-10-CM | POA: Diagnosis not present

## 2020-04-25 DIAGNOSIS — K219 Gastro-esophageal reflux disease without esophagitis: Secondary | ICD-10-CM | POA: Diagnosis not present

## 2020-04-25 DIAGNOSIS — E039 Hypothyroidism, unspecified: Secondary | ICD-10-CM | POA: Diagnosis not present

## 2020-06-20 DIAGNOSIS — H401310 Pigmentary glaucoma, right eye, stage unspecified: Secondary | ICD-10-CM | POA: Diagnosis not present

## 2020-06-20 DIAGNOSIS — H21231 Degeneration of iris (pigmentary), right eye: Secondary | ICD-10-CM | POA: Diagnosis not present

## 2020-06-20 DIAGNOSIS — H40022 Open angle with borderline findings, high risk, left eye: Secondary | ICD-10-CM | POA: Diagnosis not present

## 2020-06-20 DIAGNOSIS — H524 Presbyopia: Secondary | ICD-10-CM | POA: Diagnosis not present

## 2020-06-20 DIAGNOSIS — Z961 Presence of intraocular lens: Secondary | ICD-10-CM | POA: Diagnosis not present

## 2020-06-25 DIAGNOSIS — E039 Hypothyroidism, unspecified: Secondary | ICD-10-CM | POA: Diagnosis not present

## 2020-06-25 DIAGNOSIS — K219 Gastro-esophageal reflux disease without esophagitis: Secondary | ICD-10-CM | POA: Diagnosis not present

## 2020-06-25 DIAGNOSIS — E7800 Pure hypercholesterolemia, unspecified: Secondary | ICD-10-CM | POA: Diagnosis not present

## 2020-07-25 DIAGNOSIS — E7800 Pure hypercholesterolemia, unspecified: Secondary | ICD-10-CM | POA: Diagnosis not present

## 2020-07-25 DIAGNOSIS — K219 Gastro-esophageal reflux disease without esophagitis: Secondary | ICD-10-CM | POA: Diagnosis not present

## 2020-07-25 DIAGNOSIS — E039 Hypothyroidism, unspecified: Secondary | ICD-10-CM | POA: Diagnosis not present

## 2020-09-25 DIAGNOSIS — E7800 Pure hypercholesterolemia, unspecified: Secondary | ICD-10-CM | POA: Diagnosis not present

## 2020-09-25 DIAGNOSIS — E039 Hypothyroidism, unspecified: Secondary | ICD-10-CM | POA: Diagnosis not present

## 2020-09-25 DIAGNOSIS — K219 Gastro-esophageal reflux disease without esophagitis: Secondary | ICD-10-CM | POA: Diagnosis not present

## 2020-10-17 DIAGNOSIS — N138 Other obstructive and reflux uropathy: Secondary | ICD-10-CM | POA: Diagnosis not present

## 2020-10-22 DIAGNOSIS — E039 Hypothyroidism, unspecified: Secondary | ICD-10-CM | POA: Diagnosis not present

## 2020-10-22 DIAGNOSIS — Z0000 Encounter for general adult medical examination without abnormal findings: Secondary | ICD-10-CM | POA: Diagnosis not present

## 2020-10-22 DIAGNOSIS — E7800 Pure hypercholesterolemia, unspecified: Secondary | ICD-10-CM | POA: Diagnosis not present

## 2020-10-24 ENCOUNTER — Other Ambulatory Visit: Payer: Self-pay | Admitting: Internal Medicine

## 2020-10-24 DIAGNOSIS — I6523 Occlusion and stenosis of bilateral carotid arteries: Secondary | ICD-10-CM | POA: Diagnosis not present

## 2020-10-24 DIAGNOSIS — I350 Nonrheumatic aortic (valve) stenosis: Secondary | ICD-10-CM | POA: Diagnosis not present

## 2020-10-24 DIAGNOSIS — Z872 Personal history of diseases of the skin and subcutaneous tissue: Secondary | ICD-10-CM | POA: Diagnosis not present

## 2020-10-24 DIAGNOSIS — I447 Left bundle-branch block, unspecified: Secondary | ICD-10-CM | POA: Diagnosis not present

## 2020-10-24 DIAGNOSIS — Z0000 Encounter for general adult medical examination without abnormal findings: Secondary | ICD-10-CM | POA: Diagnosis not present

## 2020-10-24 DIAGNOSIS — E039 Hypothyroidism, unspecified: Secondary | ICD-10-CM | POA: Diagnosis not present

## 2020-10-24 DIAGNOSIS — Z23 Encounter for immunization: Secondary | ICD-10-CM | POA: Diagnosis not present

## 2020-10-24 DIAGNOSIS — E7211 Homocystinuria: Secondary | ICD-10-CM | POA: Diagnosis not present

## 2020-10-24 DIAGNOSIS — K219 Gastro-esophageal reflux disease without esophagitis: Secondary | ICD-10-CM | POA: Diagnosis not present

## 2020-11-19 DIAGNOSIS — H401311 Pigmentary glaucoma, right eye, mild stage: Secondary | ICD-10-CM | POA: Diagnosis not present

## 2020-11-19 DIAGNOSIS — H21231 Degeneration of iris (pigmentary), right eye: Secondary | ICD-10-CM | POA: Diagnosis not present

## 2020-11-19 DIAGNOSIS — H00024 Hordeolum internum left upper eyelid: Secondary | ICD-10-CM | POA: Diagnosis not present

## 2020-11-19 DIAGNOSIS — H40022 Open angle with borderline findings, high risk, left eye: Secondary | ICD-10-CM | POA: Diagnosis not present

## 2020-11-22 ENCOUNTER — Ambulatory Visit
Admission: RE | Admit: 2020-11-22 | Discharge: 2020-11-22 | Disposition: A | Discharge status: Home / Self Care | Payer: No Typology Code available for payment source | Source: Ambulatory Visit | Attending: Internal Medicine | Admitting: Internal Medicine

## 2020-11-22 ENCOUNTER — Other Ambulatory Visit: Payer: Self-pay

## 2020-11-22 DIAGNOSIS — E785 Hyperlipidemia, unspecified: Secondary | ICD-10-CM | POA: Diagnosis not present

## 2020-11-22 DIAGNOSIS — I700 Atherosclerosis of aorta: Secondary | ICD-10-CM | POA: Diagnosis not present

## 2020-11-22 DIAGNOSIS — I6523 Occlusion and stenosis of bilateral carotid arteries: Secondary | ICD-10-CM

## 2020-11-25 DIAGNOSIS — E039 Hypothyroidism, unspecified: Secondary | ICD-10-CM | POA: Diagnosis not present

## 2020-11-25 DIAGNOSIS — K219 Gastro-esophageal reflux disease without esophagitis: Secondary | ICD-10-CM | POA: Diagnosis not present

## 2020-11-25 DIAGNOSIS — E7800 Pure hypercholesterolemia, unspecified: Secondary | ICD-10-CM | POA: Diagnosis not present

## 2020-12-10 DIAGNOSIS — I350 Nonrheumatic aortic (valve) stenosis: Secondary | ICD-10-CM | POA: Diagnosis not present

## 2020-12-10 DIAGNOSIS — I2510 Atherosclerotic heart disease of native coronary artery without angina pectoris: Secondary | ICD-10-CM | POA: Diagnosis not present

## 2020-12-10 DIAGNOSIS — I2584 Coronary atherosclerosis due to calcified coronary lesion: Secondary | ICD-10-CM | POA: Diagnosis not present

## 2021-01-24 DIAGNOSIS — K219 Gastro-esophageal reflux disease without esophagitis: Secondary | ICD-10-CM | POA: Diagnosis not present

## 2021-01-24 DIAGNOSIS — I6523 Occlusion and stenosis of bilateral carotid arteries: Secondary | ICD-10-CM | POA: Diagnosis not present

## 2021-01-24 DIAGNOSIS — E039 Hypothyroidism, unspecified: Secondary | ICD-10-CM | POA: Diagnosis not present

## 2021-01-24 DIAGNOSIS — E7800 Pure hypercholesterolemia, unspecified: Secondary | ICD-10-CM | POA: Diagnosis not present

## 2021-02-12 ENCOUNTER — Ambulatory Visit (INDEPENDENT_AMBULATORY_CARE_PROVIDER_SITE_OTHER): Payer: Medicare Other | Admitting: Cardiovascular Disease

## 2021-02-12 ENCOUNTER — Other Ambulatory Visit: Payer: Self-pay

## 2021-02-12 ENCOUNTER — Encounter: Payer: Self-pay | Admitting: Cardiovascular Disease

## 2021-02-12 VITALS — BP 124/72 | HR 73 | Ht 69.5 in | Wt 184.2 lb

## 2021-02-12 DIAGNOSIS — I350 Nonrheumatic aortic (valve) stenosis: Secondary | ICD-10-CM | POA: Diagnosis not present

## 2021-02-12 DIAGNOSIS — E7800 Pure hypercholesterolemia, unspecified: Secondary | ICD-10-CM

## 2021-02-12 DIAGNOSIS — I2510 Atherosclerotic heart disease of native coronary artery without angina pectoris: Secondary | ICD-10-CM | POA: Diagnosis not present

## 2021-02-12 DIAGNOSIS — I447 Left bundle-branch block, unspecified: Secondary | ICD-10-CM | POA: Diagnosis not present

## 2021-02-12 NOTE — Progress Notes (Signed)
Cardiology Consultation:   Patient ID: Evan Myers MRN: [id number]; DOB: 1940-12-20  Admit date: (Not on file) Date of Consult: 02/12/2021  Primary Care Provider: Deland Pretty, MD [facility] Cardiologist: Kilie Rund [place] Electrophysiologist:  None    Patient Profile:   Evan Myers is a 81 y.o. male with a hx of mild aortic stenosis, hyperlipidemia, LBBB after undergoing a coronary calcium score.  History of Present Illness:   Evan Myers remains asymptomatic.  He takes care of yard work (such as raking leaves, [employer], mowing the lawn) and goes for walks on a regular basis, without any complaints of angina, dyspnea or syncope/presyncope.  He is able to climb 3 or 4 consecutive flights of steps without stopping to catch his breath.  He denies problems with palpitations.  He has not had any dizzy spells at rest or with changes in position.  He has had a murmur for the better part of his adult life.  His most recent echocardiogram from October 2021 showed mild-moderate aortic valve stenosis (mean gradient 17 mmHg, estimated AVA area 1.2 cm, dimensionless index 0.29).  He has preserved left ventricular systolic function and normal global longitudinal left ventricular strain.  He developed a new LBBB in 2021 and this is still present on his electrocardiogram today, but has not been associated with any symptoms.  He has normal caliber of the aortic root and ascending aorta by echo and by noncontrast CT of the chest.  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  He was intolerant to more potent statins when these were tried in the past.  He underwent a coronary calcium score which showed average coronary plaque (even though the coronary calcium score was elevated at 498, this places him in the 57th percentile for age  and gender).  He does not have diabetes mellitus, known CAD (normal coronary angiography 2006) or PAD, history of stroke, congestive heart failure, or history of cardiac arrhythmia.   Past Medical History:  Diagnosis Date   Actinic keratosis    Adenomatous polyp of colon    Allergic rhinitis    Aortic stenosis    GERD (gastroesophageal reflux disease)    Heart murmur    Hemorrhoids    HOH (hard of hearing)    Hyperhomocystinemia ([facility])    Hyperlipidemia    Mild CAD    via cath in 2008 (per office notes)   Polymyalgia rheumatica ([place])    Restless leg syndrome    Tinnitus    Urticaria    Varicose veins of left lower extremity     Past Surgical History:  Procedure Laterality Date   CARDIAC CATHETERIZATION     NM MYOCAR PERF WALL MOTION  2011   bruce myoview - normal perfusion, EF 58%   TONSILLECTOMY  1949   TRANSTHORACIC ECHOCARDIOGRAM  2011   EF=>55%; mild mitral annular calcification, mild MR; mild TR, normal RVSP; mild calcification of AV leaflets and mild valvular AS; aortic root sclerosis/calcification   VASECTOMY  1975     Home Medications:  Prior to Admission medications   Medication Sig Start Date End Date Taking? Authorizing Provider  alfuzosin (UROXATRAL) 10 MG 24 hr tablet Take 10 mg by mouth daily.    Yes [provider]  aspirin 81 MG tablet Take 81 mg by mouth daily.   Yes [provider]  Carboxymethylcellulose Sodium (THERATEARS) 0.25 %  SOLN Apply to eye as needed.   Yes [provider]  cetirizine (ZYRTEC) 10 MG tablet Take 10 mg by mouth daily.   Yes [provider]  gabapentin (NEURONTIN) 300 MG capsule Take 1 capsule (300 mg total) by mouth 3 (three) times daily as needed (nerve pain). 05/09/19  Yes Gregor Hams, MD  levothyroxine (SYNTHROID) 50 MCG tablet Take 50 mcg by mouth daily before breakfast.   Yes [provider]  pantoprazole (PROTONIX) 40 MG tablet Take 40 mg by mouth daily.   Yes [provider]  Pitavastatin Calcium (LIVALO) 2 MG TABS Take 1 mg by mouth daily.   Yes [provider]    Allergies:    Allergies  Allergen Reactions   Brinzolamide-Brimonidine Other (See Comments)    Pt states eye drops highly Irritates his eyes.   Doxycycline Other (See Comments)    constipation   Sulfamethoxazole-Trimethoprim Other (See Comments)    Pt states bactrim elevates his creatinine    Social History:   Social History   Socioeconomic History   Marital status: Married    Spouse name: Not on file   Number of children: 2   Years of education: bachelor's   Highest education level: Not on file  Occupational History   Occupation: electronics/engineering  Tobacco Use   Smoking status: Former    Packs/day: 1.00    Years: 6.00    Pack years: 6.00    Types: Cigarettes    Quit date: 09/08/1968    Years since quitting: 52.4   Smokeless tobacco: Never  Substance and Sexual Activity   Alcohol use: No   Drug use: No   Sexual activity: Not on file  Other Topics Concern   Not on file  Social History Narrative   Not on file   Social Determinants of Health   Financial Resource Strain: Not on file  Food Insecurity: Not on file  Transportation Needs: Not on file  Physical Activity: Not on file  Stress: Not on file  Social Connections: Not on file  Intimate Partner Violence: Not on file    Family History:    Family History  Problem Relation Age of Onset   Hypertension Mother    Hyperlipidemia Mother    Heart failure Mother    Skin cancer Mother    Heart attack Father 64   Hypertension Father    COPD Father    Emphysema Father    Stroke Father    Hypotension Maternal Grandmother    Diabetes Paternal Grandmother    Nephrolithiasis Brother        half - sibling   Hypertension Brother        half - sibling   Hyperlipidemia Brother        half - sibling   Hypertension Sister    SIDS Brother        9 months, half - sibling     ROS:  Please see  the history of present illness.  All other ROS reviewed and negative.     Physical Exam/Data:   Vitals:   02/12/21 1045  BP: 124/72  Pulse: 73  SpO2: 98%  Weight: 184 lb 3.2 oz (83.6 kg)  Height: 5' 9.5" (1.765 m)   @IOBRIEF @ Last 3 Weights 02/12/2021 10/26/2019 07/14/2019  Weight (lbs) 184 lb 3.2 oz 176 lb 174 lb 12.8 oz  Weight (kg) 83.553 kg 79.833 kg 79.289 kg     Body mass index is 26.81 kg/m.   General:  Alert, oriented x3, no distress, appears physically fit, younger than stated age Head: no evidence of trauma, PERRL, EOMI, no exophtalmos or lid lag, no myxedema, no xanthelasma; normal ears, nose and oropharynx Neck: normal jugular venous pulsations and no hepatojugular reflux; brisk carotid pulses without delay and bilateral carotid bruits radiating from chest chest: clear to auscultation, no signs of consolidation by percussion or palpation, normal fremitus, symmetrical and full respiratory excursions Cardiovascular: normal position and quality of the apical impulse, regular rhythm, normal first and very distant/split second heart sounds, early-mid peaking 2/6 aortic ejection murmur, no diastolic murmurs, rubs or gallops Abdomen: no tenderness or distention, no masses by palpation, no abnormal pulsatility or arterial bruits, normal bowel sounds, no hepatosplenomegaly Extremities: no clubbing, cyanosis or edema; 2+ radial, ulnar and brachial pulses bilaterally; 2+ right femoral, posterior tibial and dorsalis pedis pulses; 2+ left femoral, posterior tibial and dorsalis pedis pulses; no subclavian or femoral bruits Neurological: grossly nonfocal Psych: Normal mood and affect   EKG:  The EKG was personally reviewed.  This shows normal sinus rhythm, sinus arrhythmia, broad left bundle branch block with QRS 160 ms.  QTc 493 ms.   Relevant CV Studies: Reviewed coronary calcium score from October 2022 (score 498, 57th percentile)   echocardiogram October 2021  1. Septal dyskinesis  consistent with bundle branch block. Left  ventricular ejection fraction, by estimation, is 60 to 65%. The left  ventricle has normal function. The left ventricle has no regional wall  motion abnormalities. Left ventricular diastolic  parameters are consistent with Grade I diastolic dysfunction (impaired  relaxation). The average left ventricular global longitudinal strain is  -20.6 %. The global longitudinal strain is normal.   2. Right ventricular systolic function is normal. The right ventricular  size is normal. There is normal pulmonary artery systolic pressure.   3. Left atrial size was mildly dilated.   4. The mitral valve is normal in structure. Trivial mitral valve  regurgitation. No evidence of mitral stenosis.   5. The aortic valve is normal in structure. There is moderate  calcification of the aortic valve. There is moderate thickening of the  aortic valve. Aortic valve regurgitation is mild. Mild aortic valve  stenosis. Aortic valve area, by VTI measures 1.22  cm. Aortic valve mean gradient measures 16.7 mmHg. Aortic valve Vmax  measures 2.67 m/s.   6. The inferior vena cava is normal in size with greater than 50%  respiratory variability, suggesting right atrial pressure of 3 mmHg.   Comparison(s): 09/20/13 EF 60-65%.   Laboratory Data:  10/22/2020 Cholesterol 155, HDL 51, triglycerides 55 TSH 2.29  Radiology/Studies:  No results found.  Assessment and Plan:   1. Nonrheumatic aortic valve stenosis   2. Coronary artery disease involving native coronary artery of native heart without angina pectoris   3. LBBB (left bundle branch block)   4. Hypercholesterolemia       AS: He remains asymptomatic..  We will plan to repeat an echocardiogram in 1-2 years, sooner if he develops symptoms of angina/dyspnea/syncope with exertion.  I am pleased that he is maintaining a very active lifestyle, since this should give [place] earlier warning of disease progression.  Again, we  briefly reviewed the options of SAVR versus TAVR, depending on whether or not he has evidence of multivessel CAD requiring CABG and/for aneurysm.   CAD: Although his calcium score is over 400, this is not so unusual in a male patient that is almost 81 years old.  It only places  him in the 57th percentile.  He is asymptomatic and has normal left ventricular systolic function.  As long as he remains physically active without complaints, I do not think further investigation is necessary.  We reviewed the fact that when his aortic stenosis becomes symptomatic he will need coronary angiography as part of his work-up. LBBB: No signs or symptoms of high-grade AV block.  Preserved LVEF. HLP: Based on the current calcium score it is very reasonable to treat his lipid profile more aggressively, to target LDL less than 70.  His dose of statin has already been increased and he will have a repeat lipid profile with PCP    Signed, Sanda Klein, MD  02/12/2021 12:54 PM

## 2021-02-12 NOTE — Patient Instructions (Signed)

## 2021-04-15 DIAGNOSIS — H00024 Hordeolum internum left upper eyelid: Secondary | ICD-10-CM | POA: Diagnosis not present

## 2021-04-15 DIAGNOSIS — H21231 Degeneration of iris (pigmentary), right eye: Secondary | ICD-10-CM | POA: Diagnosis not present

## 2021-04-15 DIAGNOSIS — H401310 Pigmentary glaucoma, right eye, stage unspecified: Secondary | ICD-10-CM | POA: Diagnosis not present

## 2021-04-15 DIAGNOSIS — H40022 Open angle with borderline findings, high risk, left eye: Secondary | ICD-10-CM | POA: Diagnosis not present

## 2021-05-07 DIAGNOSIS — H21231 Degeneration of iris (pigmentary), right eye: Secondary | ICD-10-CM | POA: Diagnosis not present

## 2021-05-07 DIAGNOSIS — H00024 Hordeolum internum left upper eyelid: Secondary | ICD-10-CM | POA: Diagnosis not present

## 2021-05-07 DIAGNOSIS — H401310 Pigmentary glaucoma, right eye, stage unspecified: Secondary | ICD-10-CM | POA: Diagnosis not present

## 2021-05-07 DIAGNOSIS — H40022 Open angle with borderline findings, high risk, left eye: Secondary | ICD-10-CM | POA: Diagnosis not present

## 2021-09-19 DIAGNOSIS — H21231 Degeneration of iris (pigmentary), right eye: Secondary | ICD-10-CM | POA: Diagnosis not present

## 2021-09-19 DIAGNOSIS — H40022 Open angle with borderline findings, high risk, left eye: Secondary | ICD-10-CM | POA: Diagnosis not present

## 2021-09-19 DIAGNOSIS — H401311 Pigmentary glaucoma, right eye, mild stage: Secondary | ICD-10-CM | POA: Diagnosis not present

## 2021-10-24 DIAGNOSIS — E039 Hypothyroidism, unspecified: Secondary | ICD-10-CM | POA: Diagnosis not present

## 2021-10-24 DIAGNOSIS — I8390 Asymptomatic varicose veins of unspecified lower extremity: Secondary | ICD-10-CM | POA: Diagnosis not present

## 2021-10-24 DIAGNOSIS — I6523 Occlusion and stenosis of bilateral carotid arteries: Secondary | ICD-10-CM | POA: Diagnosis not present

## 2021-10-24 DIAGNOSIS — Z23 Encounter for immunization: Secondary | ICD-10-CM | POA: Diagnosis not present

## 2021-10-24 DIAGNOSIS — E7800 Pure hypercholesterolemia, unspecified: Secondary | ICD-10-CM | POA: Diagnosis not present

## 2021-10-29 DIAGNOSIS — I8390 Asymptomatic varicose veins of unspecified lower extremity: Secondary | ICD-10-CM | POA: Diagnosis not present

## 2021-10-29 DIAGNOSIS — I6523 Occlusion and stenosis of bilateral carotid arteries: Secondary | ICD-10-CM | POA: Diagnosis not present

## 2021-10-29 DIAGNOSIS — L578 Other skin changes due to chronic exposure to nonionizing radiation: Secondary | ICD-10-CM | POA: Diagnosis not present

## 2021-10-29 DIAGNOSIS — Z0000 Encounter for general adult medical examination without abnormal findings: Secondary | ICD-10-CM | POA: Diagnosis not present

## 2021-10-29 DIAGNOSIS — L509 Urticaria, unspecified: Secondary | ICD-10-CM | POA: Diagnosis not present

## 2021-10-29 DIAGNOSIS — E7800 Pure hypercholesterolemia, unspecified: Secondary | ICD-10-CM | POA: Diagnosis not present

## 2021-10-29 DIAGNOSIS — E039 Hypothyroidism, unspecified: Secondary | ICD-10-CM | POA: Diagnosis not present

## 2021-10-29 DIAGNOSIS — Z23 Encounter for immunization: Secondary | ICD-10-CM | POA: Diagnosis not present

## 2021-10-29 DIAGNOSIS — I2510 Atherosclerotic heart disease of native coronary artery without angina pectoris: Secondary | ICD-10-CM | POA: Diagnosis not present

## 2021-10-29 LAB — LAB REPORT - SCANNED: EGFR: 62

## 2021-11-11 DIAGNOSIS — Z1211 Encounter for screening for malignant neoplasm of colon: Secondary | ICD-10-CM | POA: Diagnosis not present

## 2021-11-11 DIAGNOSIS — Z1212 Encounter for screening for malignant neoplasm of rectum: Secondary | ICD-10-CM | POA: Diagnosis not present

## 2021-11-19 LAB — COLOGUARD: COLOGUARD: NEGATIVE

## 2021-12-12 DIAGNOSIS — J301 Allergic rhinitis due to pollen: Secondary | ICD-10-CM | POA: Diagnosis not present

## 2021-12-12 DIAGNOSIS — J3081 Allergic rhinitis due to animal (cat) (dog) hair and dander: Secondary | ICD-10-CM | POA: Diagnosis not present

## 2021-12-12 DIAGNOSIS — J3089 Other allergic rhinitis: Secondary | ICD-10-CM | POA: Diagnosis not present

## 2021-12-12 DIAGNOSIS — R21 Rash and other nonspecific skin eruption: Secondary | ICD-10-CM | POA: Diagnosis not present

## 2022-01-07 IMAGING — MR MR LUMBAR SPINE W/O CM
5 series · 45 of 48 positions shown · non-contrast
Comparison: Lumbar radiography 05/09/2019

CLINICAL DATA: Lumbar radiculopathy. Low back pain and progressive
neurologic deficit. Left leg pain

EXAM:
MRI LUMBAR SPINE WITHOUT CONTRAST
TECHNIQUE: Multiplanar, multisequence MR imaging of the lumbar spine was
performed. No intravenous contrast was administered.

[Series 3: T2 post-contrast · sagittal · 4.0mm · 0.88mm/px · 7 of 16 slices shown]
[im 1/16]
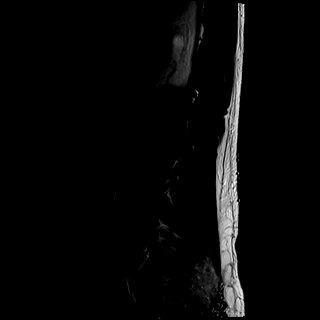
[im 3/16]
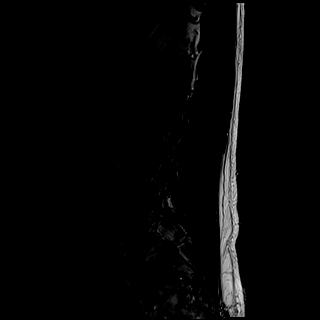
[im 6/16]
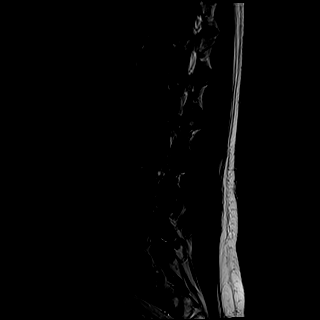
[im 8/16]
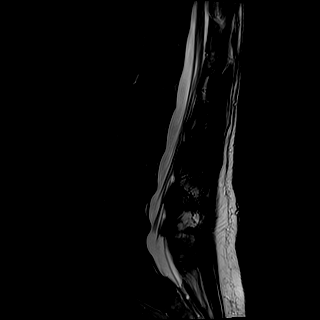
[im 11/16]
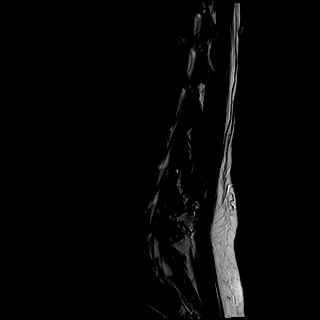
[im 13/16]
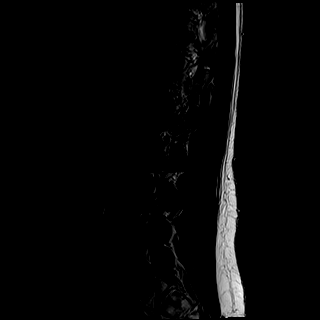
[im 16/16]
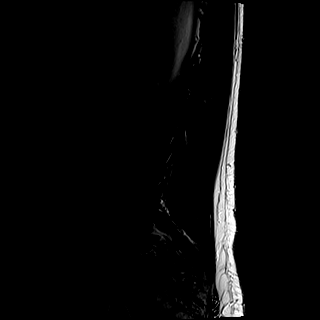

[Series 4: T1 · sagittal · 4.0mm · 0.88mm/px · 7 of 16 slices shown (1 of 2)]
[im 1/16]
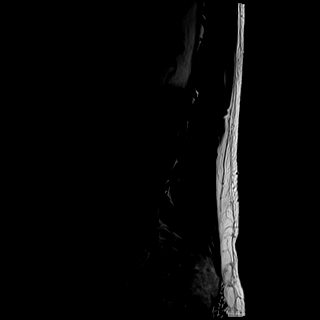
[im 3/16]
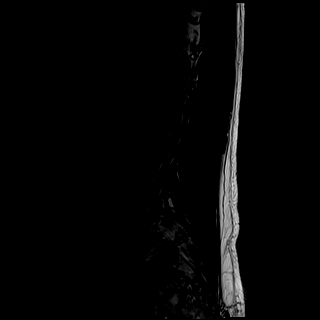
[im 6/16]
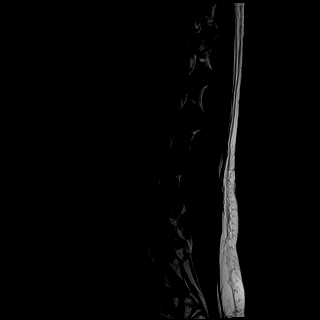
[im 8/16]
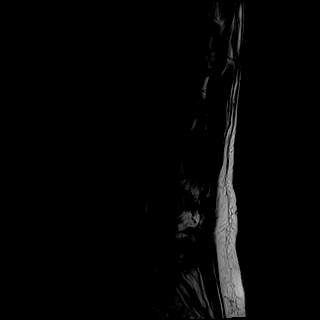
[im 11/16]
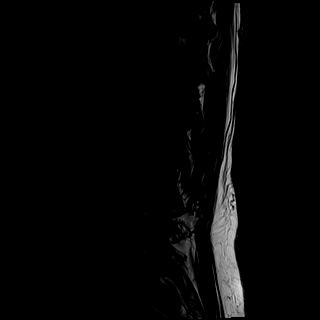
[im 13/16]
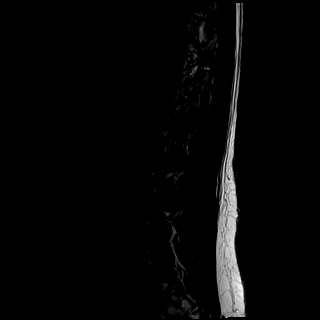
[im 16/16]
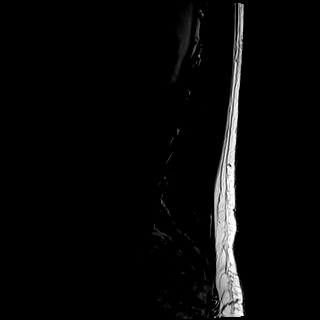

[Series 5: tirm sag · sagittal · 4.0mm · 0.55mm/px · 6 of 16 slices shown]
[im 1/16]
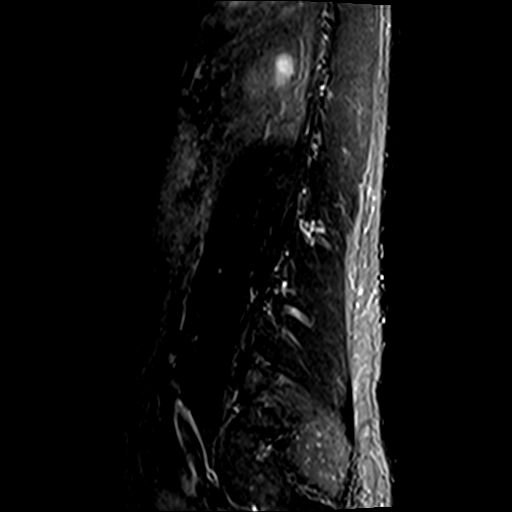
[im 4/16]
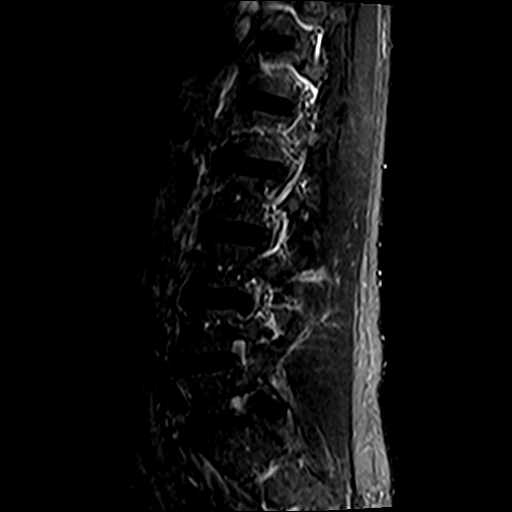
[im 7/16]
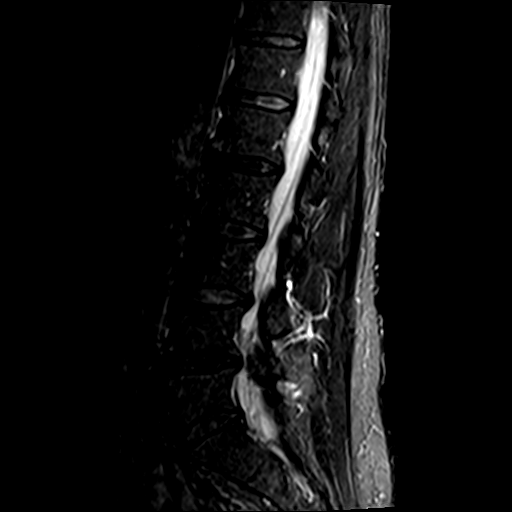
[im 10/16]
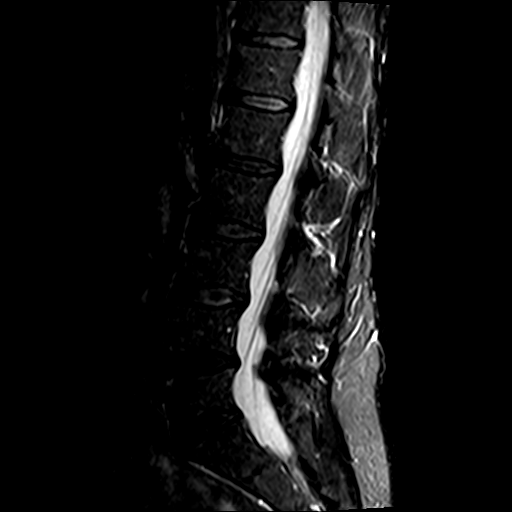
[im 13/16]
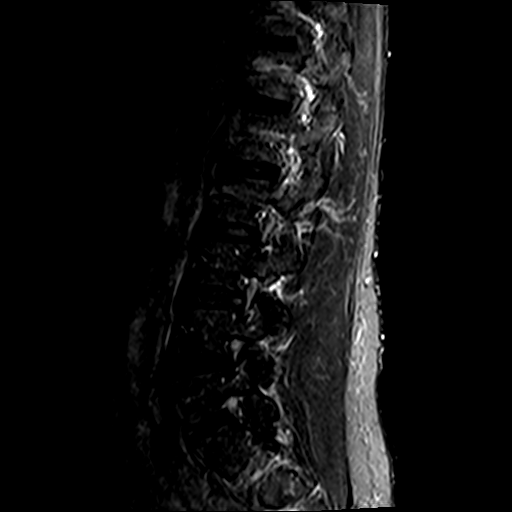
[im 16/16]
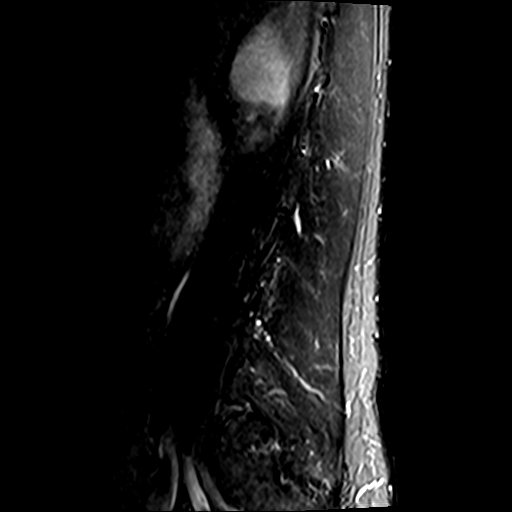

[Series 6: T1 · axial · 4.0mm · 0.78mm/px · z∈[-183,+38]mm · 11 of 36 slices shown (2 of 2)]
[im 1/36]
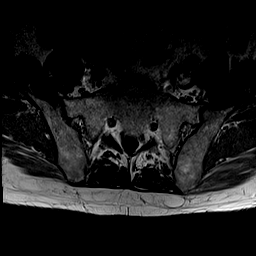
[im 3/36]
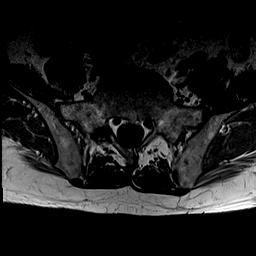
[im 6/36]
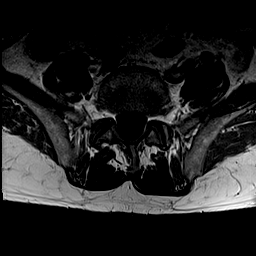
[im 9/36]
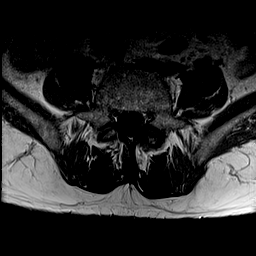
[im 11/36]
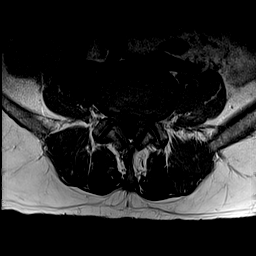
[im 14/36]
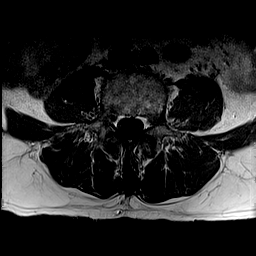
[im 17/36]
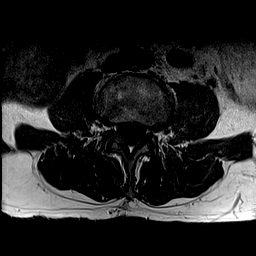
[im 19/36]
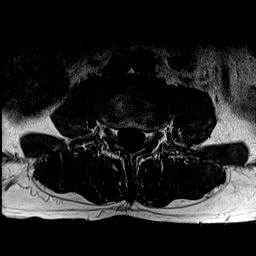
[im 25/36]
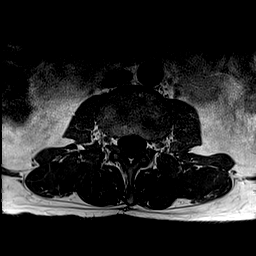
[im 30/36]
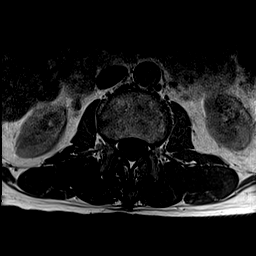
[im 36/36]
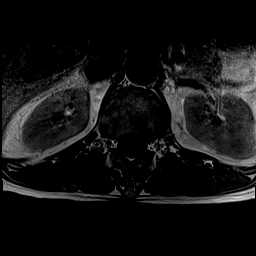

[Series 7: T2 · axial · 4.0mm · 0.78mm/px · z∈[-183,+38]mm · 14 of 36 slices shown]
[im 1/36]
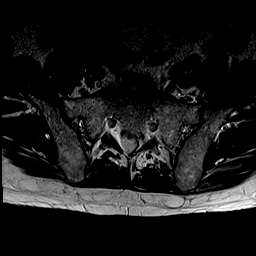
[im 3/36]
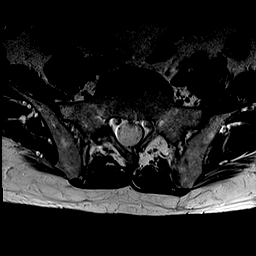
[im 6/36]
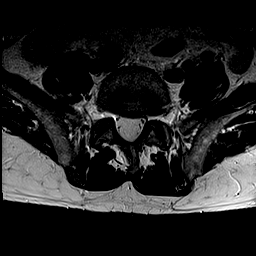
[im 9/36]
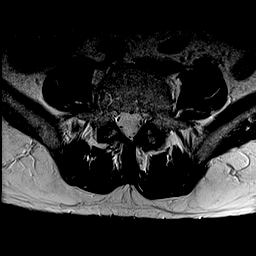
[im 11/36]
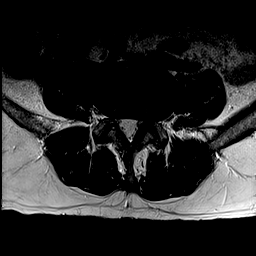
[im 14/36]
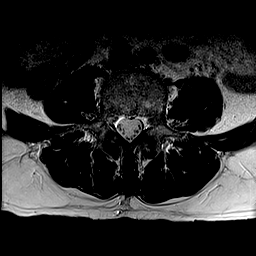
[im 17/36]
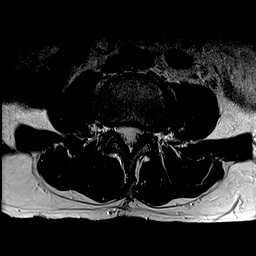
[im 19/36]
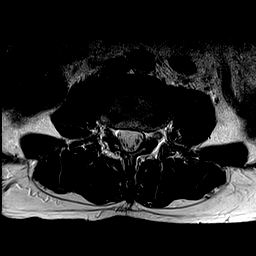
[im 22/36]
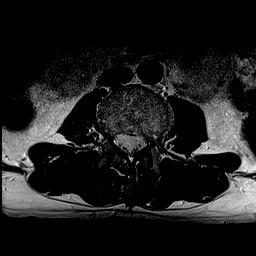
[im 25/36]
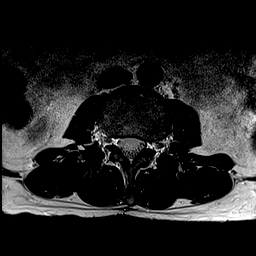
[im 27/36]
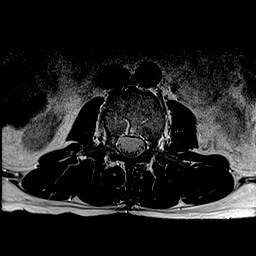
[im 30/36]
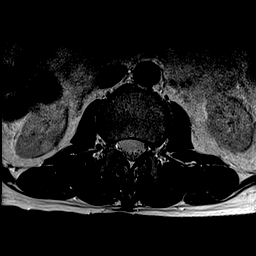
[im 33/36]
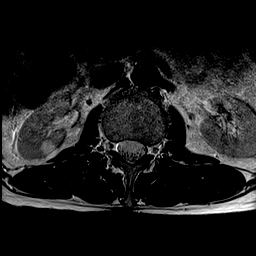
[im 36/36]
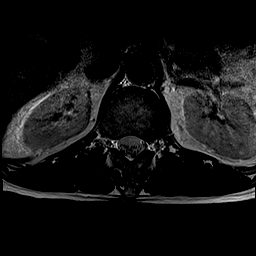

[45 of 48 positions shown; findings below may reference images not displayed]

FINDINGS: Segmentation:  Standard.

Alignment:  Physiologic.

Vertebrae:  No fracture, evidence of discitis, or bone lesion.

Conus medullaris and cauda equina: Conus extends to the L1 level.
Conus and cauda equina appear normal.

Paraspinal and other soft tissues: Small right renal cystic
intensity.

Disc levels:

T12- L1: Unremarkable.

L1-L2: Mild disc narrowing and bulging.

L2-L3: Mild disc narrowing and bulging

L3-L4: Mild disc narrowing and bulging

L4-L5: Left foraminal extrusion obliterating the perineural fat.
There is underlying disc bulging and mild endplate spurring.

L5-S1:Mild disc bulging and facet spurring.
IMPRESSION: 1. L4-5 left foraminal extrusion with prominent L4 impingement.
2. Otherwise mild for age degenerative changes without neural
compression.

## 2022-01-29 DIAGNOSIS — N138 Other obstructive and reflux uropathy: Secondary | ICD-10-CM | POA: Diagnosis not present

## 2022-02-24 DIAGNOSIS — R6889 Other general symptoms and signs: Secondary | ICD-10-CM | POA: Diagnosis not present

## 2022-03-24 DIAGNOSIS — H21231 Degeneration of iris (pigmentary), right eye: Secondary | ICD-10-CM | POA: Diagnosis not present

## 2022-03-24 DIAGNOSIS — H401311 Pigmentary glaucoma, right eye, mild stage: Secondary | ICD-10-CM | POA: Diagnosis not present

## 2022-03-24 DIAGNOSIS — D3132 Benign neoplasm of left choroid: Secondary | ICD-10-CM | POA: Diagnosis not present

## 2022-03-24 DIAGNOSIS — H40022 Open angle with borderline findings, high risk, left eye: Secondary | ICD-10-CM | POA: Diagnosis not present

## 2022-03-24 DIAGNOSIS — H524 Presbyopia: Secondary | ICD-10-CM | POA: Diagnosis not present

## 2022-04-23 DIAGNOSIS — D225 Melanocytic nevi of trunk: Secondary | ICD-10-CM | POA: Diagnosis not present

## 2022-04-23 DIAGNOSIS — L570 Actinic keratosis: Secondary | ICD-10-CM | POA: Diagnosis not present

## 2022-04-23 DIAGNOSIS — L821 Other seborrheic keratosis: Secondary | ICD-10-CM | POA: Diagnosis not present

## 2022-04-23 DIAGNOSIS — L814 Other melanin hyperpigmentation: Secondary | ICD-10-CM | POA: Diagnosis not present

## 2022-05-08 ENCOUNTER — Encounter: Payer: Self-pay | Admitting: Nurse Practitioner

## 2022-05-08 ENCOUNTER — Ambulatory Visit: Payer: TRICARE For Life (TFL) | Attending: Nurse Practitioner | Admitting: Nurse Practitioner

## 2022-05-08 VITALS — BP 112/62 | HR 65 | Ht 69.0 in | Wt 185.8 lb

## 2022-05-08 DIAGNOSIS — E785 Hyperlipidemia, unspecified: Secondary | ICD-10-CM | POA: Diagnosis not present

## 2022-05-08 DIAGNOSIS — I350 Nonrheumatic aortic (valve) stenosis: Secondary | ICD-10-CM

## 2022-05-08 DIAGNOSIS — I2510 Atherosclerotic heart disease of native coronary artery without angina pectoris: Secondary | ICD-10-CM

## 2022-05-08 DIAGNOSIS — I447 Left bundle-branch block, unspecified: Secondary | ICD-10-CM | POA: Diagnosis not present

## 2022-05-08 NOTE — Patient Instructions (Signed)
Medication Instructions:  Your physician recommends that you continue on your current medications as directed. Please refer to the Current Medication list given to you today.  *If you need a refill on your cardiac medications before your next appointment, please call your pharmacy*  Lab Work: NONE ordered at this time of appointment   If you have labs (blood work) drawn today and your tests are completely normal, you will receive your results only by: MyChart Message (if you have MyChart) OR A paper copy in the mail If you have any lab test that is abnormal or we need to change your treatment, we will call you to review the results.  Testing/Procedures: NONE ordered at this time of appointment   Follow-Up: At [facility], you and your health needs are our priority.  As part of our continuing mission to provide you with exceptional heart care, we have created designated Provider Care Teams.  These Care Teams include your primary Cardiologist (physician) and Advanced Practice Providers (APPs -  Physician Assistants and Nurse Practitioners) who all work together to provide you with the care you need, when you need it.  Your next appointment:   1 year(s)  Provider:   Mihai Croitoru, MD     Other Instructions  

## 2022-05-08 NOTE — Progress Notes (Signed)
Office Visit    Patient Name: Evan Myers Date of Encounter: 05/08/2022  Primary Care Provider:  Merri Brunette, MD Primary Cardiologist:  Thurmon Fair, MD  Chief Complaint    82 year old male with a history of CAD, LBBB, aortic stenosis, hyperlipidemia, hypothyroidism, PMR currently in remission, bladder outlet obstruction, and GERD who presents for follow-up related to CAD and aortic stenosis.  Past Medical History    Past Medical History:  Diagnosis Date   Actinic keratosis    Adenomatous polyp of colon    Allergic rhinitis    Aortic stenosis    GERD (gastroesophageal reflux disease)    Heart murmur    Hemorrhoids    HOH (hard of hearing)    Hyperhomocystinemia    Hyperlipidemia    Mild CAD    via cath in 2008 (per office notes)   Polymyalgia rheumatica    Restless leg syndrome    Tinnitus    Urticaria    Varicose veins of left lower extremity    Past Surgical History:  Procedure Laterality Date   CARDIAC CATHETERIZATION     NM MYOCAR PERF WALL MOTION  2011   bruce myoview - normal perfusion, EF 58%   TONSILLECTOMY  1949   TRANSTHORACIC ECHOCARDIOGRAM  2011   EF=>55%; mild mitral annular calcification, mild MR; mild TR, normal RVSP; mild calcification of AV leaflets and mild valvular AS; aortic root sclerosis/calcification   VASECTOMY  1975    Allergies  Allergies  Allergen Reactions   Brinzolamide-Brimonidine Other (See Comments)    Pt states eye drops highly Irritates his eyes.   Doxycycline Other (See Comments)    constipation   Sulfamethoxazole-Trimethoprim Other (See Comments)    Pt states bactrim elevates his creatinine     Labs/Other Studies Reviewed    The following studies were reviewed today: Echo 10/2019: IMPRESSIONS     1. Septal dyskinesis consistent with bundle branch block. Left  ventricular ejection fraction, by estimation, is 60 to 65%. The left  ventricle has normal function. The left ventricle has no regional wall   motion abnormalities. Left ventricular diastolic  parameters are consistent with Grade I diastolic dysfunction (impaired  relaxation). The average left ventricular global longitudinal strain is  -20.6 %. The global longitudinal strain is normal.   2. Right ventricular systolic function is normal. The right ventricular  size is normal. There is normal pulmonary artery systolic pressure.   3. Left atrial size was mildly dilated.   4. The mitral valve is normal in structure. Trivial mitral valve  regurgitation. No evidence of mitral stenosis.   5. The aortic valve is normal in structure. There is moderate  calcification of the aortic valve. There is moderate thickening of the  aortic valve. Aortic valve regurgitation is mild. Mild aortic valve  stenosis. Aortic valve area, by VTI measures 1.22  cm. Aortic valve mean gradient measures 16.7 mmHg. Aortic valve Vmax  measures 2.67 m/s.   6. The inferior vena cava is normal in size with greater than 50%  respiratory variability, suggesting right atrial pressure of 3 mmHg.   Comparison(s): 09/20/13 EF 60-65%.    Recent Labs: No results found for requested labs within last 365 days.  Recent Lipid Panel No results found for: "CHOL", "TRIG", "HDL", "CHOLHDL", "VLDL", "LDLCALC", "LDLDIRECT"  History of Present Illness    82 year old male with the above past medical history including CAD, LBBB, aortic stenosis, hyperlipidemia, hypothyroidism, PMR currently in remission, bladder outlet obstruction, and GERD.  Prior cardiac  catheterization in 2006 showed no evidence of obstructive CAD.  Coronary calcium score was 498 (57 percentile).  Most recent echocardiogram 10/2019 showed mild to moderate aortic valve stenosis, mean gradient 17 mmHg.  He has a history of LBBB, he has remained asymptomatic.  He was last seen in the office on 02/12/2021 and was stable from a cardiac standpoint.  He was asymptomatic with his aortic stenosis.  Repeat echocardiogram  was recommended in 1 to 2 years.  He denied symptoms concerning for angina.  He presents today for follow-up.  Since his last visit he has been stable from a cardiac standpoint.  He denies chest pain, dyspnea, palpitations, dizziness, presyncope, syncope, edema, PND, orthopnea, weight gain.  Overall, he reports feeling well.   Home Medications    Current Outpatient Medications  Medication Sig Dispense Refill   alfuzosin (UROXATRAL) 10 MG 24 hr tablet Take 10 mg by mouth daily.      aspirin 81 MG tablet Take 81 mg by mouth daily.     Carboxymethylcellulose Sodium (THERATEARS) 0.25 % SOLN Apply to eye as needed.     cetirizine (ZYRTEC) 10 MG tablet Take 10 mg by mouth daily.     ciprofloxacin (CIPRO) 500 MG tablet Take 500 mg by mouth as needed.     latanoprost (XALATAN) 0.005 % ophthalmic solution Place 1 drop into the right eye at bedtime.     levothyroxine (SYNTHROID) 50 MCG tablet Take 50 mcg by mouth daily before breakfast.     pantoprazole (PROTONIX) 40 MG tablet Take 40 mg by mouth daily.     Pitavastatin Calcium 2 MG TABS Take 1 mg by mouth daily.     No current facility-administered medications for this visit.     Review of Systems    He denies chest pain, palpitations, dyspnea, pnd, orthopnea, n, v, dizziness, syncope, edema, weight gain, or early satiety. All other systems reviewed and are otherwise negative except as noted above.   Physical Exam    VS:  BP 112/62   Pulse 65   Ht  (1.753 m)   Wt 185 lb 12.8 oz (84.3 kg)   SpO2 96%   BMI 27.44 kg/m  GEN: Well nourished, well developed, in no acute distress. HEENT: normal. Neck: Supple, no JVD, carotid bruits, or masses. Cardiac: RRR, 3/6 murmur, no rubs, or gallops. No clubbing, cyanosis, edema.  Radials/DP/PT 2+ and equal bilaterally.  Respiratory:  Respirations regular and unlabored, clear to auscultation bilaterally. GI: Soft, nontender, nondistended, BS + x 4. MS: no deformity or atrophy. Skin: warm and dry,  no rash. Neuro:  Strength and sensation are intact. Psych: Normal affect.  Accessory Clinical Findings    ECG personally reviewed by me today - sinus rhythm, 65 bpm, PAC, LBBB no acute changes.   Lab Results  Component Value Date   WBC 10.1 12/28/2015   HGB 13.8 12/28/2015   HCT 38.8 (L) 12/28/2015   MCV 88.8 12/28/2015   PLT 226 12/28/2015   Lab Results  Component Value Date   CREATININE 1.23 12/28/2015   BUN 16 12/28/2015   NA 140 12/28/2015   K 3.9 12/28/2015   CL 107 12/28/2015   CO2 25 12/28/2015   No results found for: "ALT", "AST", "GGT", "ALKPHOS", "BILITOT" No results found for: "CHOL", "HDL", "LDLCALC", "LDLDIRECT", "TRIG", "CHOLHDL"  No results found for: "HGBA1C"  Assessment & Plan   1. CAD: Cath in 2006 showed nonobstructive CAD. Prior coronary calcium score was 498  (57th percentile). Stable  with no anginal symptoms. No indication for ischemic evaluation. Continue aspirin, pitavastatin.   2. LBBB: No s/s of high-grade AV block, preserved LVEF.   3. Aortic stenosis: Mild to moderate on most recent echo. Asymptomatic. Consider repeat echo in 1 year for routine monitoring, sooner if clinically indicated.   4. Hyperlipidemia: No recent LDL on file.  Will request most recent labs from PCP. Continue pitavastatin.   5. Disposition: Follow-up in 1 year.       Joylene Grapes, NP 05/08/2022, 1:30 PM

## 2022-07-09 DIAGNOSIS — H524 Presbyopia: Secondary | ICD-10-CM | POA: Diagnosis not present

## 2022-08-26 DIAGNOSIS — R6889 Other general symptoms and signs: Secondary | ICD-10-CM | POA: Diagnosis not present

## 2022-09-13 ENCOUNTER — Ambulatory Visit
Admission: RE | Admit: 2022-09-13 | Discharge: 2022-09-13 | Disposition: A | Discharge status: Home / Self Care | Payer: Medicare Other | Source: Ambulatory Visit | Attending: Physician Assistant | Admitting: Physician Assistant

## 2022-09-13 VITALS — BP 106/65 | HR 93 | Temp 100.8°F | Resp 18 | Ht 69.0 in | Wt 185.0 lb

## 2022-09-13 DIAGNOSIS — U071 COVID-19: Secondary | ICD-10-CM | POA: Diagnosis not present

## 2022-09-13 MED ORDER — ACETAMINOPHEN 325 MG PO TABS
650.0000 mg | ORAL_TABLET | Freq: Once | ORAL | Status: AC
  Administered 2022-09-13: 650 mg via ORAL

## 2022-09-13 MED ORDER — NIRMATRELVIR/RITONAVIR (PAXLOVID) TABLET (RENAL DOSING): 2.0000 | ORAL_TABLET | Freq: Two times a day (BID) | ORAL | 0 refills | Status: AC

## 2022-09-13 NOTE — Discharge Instructions (Addendum)
Take Paxlovid as prescribed Can take Tylenol as needed for fever  Can take plain Mucinex without the Decongestant for congestion and postnasal drip Rest, drink plenty of fluids

## 2022-09-13 NOTE — ED Provider Notes (Signed)
[facility]    CSN: [id number] Arrival date & time: 09/13/22  1342      History   Chief Complaint Chief Complaint  Patient presents with   Influenza    + @ home Test   COVID19    + @ home Test    HPI Evan Myers is a 82 y.o. male.   Pt reports he began experiencing bodyaches, headache, fever, rhinorrhea yesterday.  He reports testing positive for COVID at home.  He also reports taking a home flu test which was positive.  His wife was recently diagnosed with COVID.  He denies shortness of breath or wheezing.    Past Medical History:  Diagnosis Date   Actinic keratosis    Adenomatous polyp of colon    Allergic rhinitis    Aortic stenosis    GERD (gastroesophageal reflux disease)    Heart murmur    Hemorrhoids    HOH (hard of hearing)    Hyperhomocystinemia    Hyperlipidemia    Mild CAD    via cath in 2008 (per office notes)   Polymyalgia rheumatica (HCC)    Restless leg syndrome    Tinnitus    Urticaria    Varicose veins of left lower extremity     Patient Active Problem List   Diagnosis Date Noted   Cardiac murmur 09/16/2013   Aortic valve stenosis 09/16/2013   Hyperlipidemia 09/16/2013    Past Surgical History:  Procedure Laterality Date   CARDIAC CATHETERIZATION     NM MYOCAR PERF WALL MOTION  2011   bruce myoview - normal perfusion, EF 58%   TONSILLECTOMY  1949   TRANSTHORACIC ECHOCARDIOGRAM  2011   EF=>55%; mild mitral annular calcification, mild MR; mild TR, normal RVSP; mild calcification of AV leaflets and mild valvular AS; aortic root sclerosis/calcification   VASECTOMY  1975       Home Medications    Prior to Admission medications   Medication Sig Start Date End Date Taking? Authorizing Provider  alfuzosin (UROXATRAL) 10 MG 24 hr tablet Take 10 mg by mouth daily.    Yes [provider]  aspirin 81 MG tablet Take 81 mg by mouth daily.   Yes [provider]  cetirizine (ZYRTEC) 10 MG tablet Take 10 mg  by mouth daily.   Yes [provider]  ciprofloxacin (CIPRO) 500 MG tablet Take 500 mg by mouth as needed. 12/15/18  Yes [provider]  latanoprost (XALATAN) 0.005 % ophthalmic solution Place 1 drop into the right eye at bedtime. 04/24/21  Yes [provider]  levothyroxine (SYNTHROID) 50 MCG tablet Take 50 mcg by mouth daily before breakfast.   Yes [provider]  nirmatrelvir/ritonavir, renal dosing, (PAXLOVID) 10 x 150 MG & 10 x 100MG  TABS Take 2 tablets by mouth 2 (two) times daily for 5 days. Patient GFR is 60. Take nirmatrelvir (150 mg) one tablet twice daily for 5 days and ritonavir (100 mg) one tablet twice daily for 5 days. 09/13/22 09/18/22 Yes Ward, Tylene Fantasia, PA-C  pantoprazole (PROTONIX) 40 MG tablet Take 40 mg by mouth daily.   Yes [provider]  Pitavastatin Calcium 2 MG TABS Take 1 mg by mouth daily.   Yes [provider]  Carboxymethylcellulose Sodium (THERATEARS) 0.25 % SOLN Apply to eye as needed.    [provider]    Family History Family History  Problem Relation Age of Onset   Hypertension Mother    Hyperlipidemia Mother  Heart failure Mother    Skin cancer Mother    Heart attack Father 56   Hypertension Father    COPD Father    Emphysema Father    Stroke Father    Hypotension Maternal Grandmother    Diabetes Paternal Grandmother    Nephrolithiasis Brother        half - sibling   Hypertension Brother        half - sibling   Hyperlipidemia Brother        half - sibling   Hypertension Sister    SIDS Brother        9 months, half - sibling    Social History Social History   Tobacco Use   Smoking status: Former    Current packs/day: 0.00    Average packs/day: 1 pack/day for 6.0 years (6.0 ttl pk-yrs)    Types: Cigarettes    Start date: 09/09/1962    Quit date: 09/08/1968    Years since quitting: 54.0   Smokeless tobacco: Never  Vaping Use   Vaping status: Never Used  Substance Use  Topics   Alcohol use: No   Drug use: No     Allergies   Brinzolamide-brimonidine, Doxycycline, and Sulfamethoxazole-trimethoprim   Review of Systems Review of Systems  Constitutional:  Positive for fever. Negative for chills.  HENT:  Positive for sore throat. Negative for ear pain.   Eyes:  Negative for pain and visual disturbance.  Respiratory:  Negative for cough and shortness of breath.   Cardiovascular:  Negative for chest pain and palpitations.  Gastrointestinal:  Negative for abdominal pain and vomiting.  Genitourinary:  Negative for dysuria and hematuria.  Musculoskeletal:  Positive for myalgias. Negative for arthralgias and back pain.  Skin:  Negative for color change and rash.  Neurological:  Positive for headaches. Negative for seizures and syncope.  All other systems reviewed and are negative.    Physical Exam Triage Vital Signs ED Triage Vitals  Encounter Vitals Group     BP 09/13/22 1442 106/65     Systolic BP Percentile --      Diastolic BP Percentile --      Pulse Rate 09/13/22 1442 93     Resp 09/13/22 1442 18     Temp 09/13/22 1442 (!) 100.8 F (38.2 C)     Temp Source 09/13/22 1442 Oral     SpO2 09/13/22 1442 95 %     Weight 09/13/22 1438 185 lb (83.9 kg)     Height 09/13/22 1438 5\' 9"  (1.753 m)     Head Circumference --      Peak Flow --      Pain Score 09/13/22 1436 0     Pain Loc --      Pain Education --      Exclude from Growth Chart --    No data found.  Updated Vital Signs BP 106/65 (BP Location: Right Arm)   Pulse 93   Temp (!) 100.8 F (38.2 C) (Oral)   Resp 18   Ht 5\' 9"  (1.753 m)   Wt 185 lb (83.9 kg)   SpO2 95%   BMI 27.32 kg/m   Visual Acuity Right Eye Distance:   Left Eye Distance:   Bilateral Distance:    Right Eye Near:   Left Eye Near:    Bilateral Near:     Physical Exam Vitals and nursing note reviewed.  Constitutional:      General: He is not in acute distress.  Appearance: He is well-developed.  HENT:      Head: Normocephalic and atraumatic.  Eyes:     Conjunctiva/sclera: Conjunctivae normal.  Cardiovascular:     Rate and Rhythm: Normal rate and regular rhythm.     Heart sounds: No murmur heard. Pulmonary:     Effort: Pulmonary effort is normal. No respiratory distress.     Breath sounds: Normal breath sounds.  Abdominal:     Palpations: Abdomen is soft.     Tenderness: There is no abdominal tenderness.  Musculoskeletal:        General: No swelling.     Cervical back: Neck supple.  Skin:    General: Skin is warm and dry.     Capillary Refill: Capillary refill takes less than 2 seconds.  Neurological:     Mental Status: He is alert.  Psychiatric:        Mood and Affect: Mood normal.      UC Treatments / Results  Labs (all labs ordered are listed, but only abnormal results are displayed) Labs Reviewed - No data to display  EKG   Radiology No results found.  Procedures Procedures (including critical care time)  Medications Ordered in UC Medications  acetaminophen (TYLENOL) tablet 650 mg (650 mg Oral Given 09/13/22 1447)    Initial Impression / Assessment and Plan / UC Course  I have reviewed the triage vital signs and the nursing notes.  Pertinent labs & imaging results that were available during my care of the patient were reviewed by me and considered in my medical decision making (see chart for details).     COVID-positive.  Will send in Paxlovid renal dosing given no recent labs.  Patient overall well-appearing in no acute distress.  Tylenol given in clinic today with reduction of fever.  Supportive care discussed.  Return precautions discussed. Final Clinical Impressions(s) / UC Diagnoses   Final diagnoses:  COVID     Discharge Instructions      Take Paxlovid as prescribed Can take Tylenol as needed for fever  Can take plain Mucinex without the Decongestant for congestion and postnasal drip Rest, drink plenty of fluids   ED Prescriptions      Medication Sig Dispense Auth. Provider   nirmatrelvir/ritonavir, renal dosing, (PAXLOVID) 10 x 150 MG & 10 x 100MG  TABS Take 2 tablets by mouth 2 (two) times daily for 5 days. Patient GFR is 60. Take nirmatrelvir (150 mg) one tablet twice daily for 5 days and ritonavir (100 mg) one tablet twice daily for 5 days. 20 tablet Ward, Tylene Fantasia, PA-C      PDMP not reviewed this encounter.   Ward, Tylene Fantasia, PA-C 09/13/22 1546

## 2022-09-13 NOTE — ED Triage Notes (Signed)
Tested positive tonight (09/12/2022) for Covid and influenza B. 99.2 degree fever, mild headache, mild body aches, runny nose, mild sore throat. - Entered by patient

## 2022-09-14 DIAGNOSIS — R197 Diarrhea, unspecified: Secondary | ICD-10-CM | POA: Diagnosis not present

## 2022-09-14 DIAGNOSIS — R1111 Vomiting without nausea: Secondary | ICD-10-CM | POA: Diagnosis not present

## 2022-10-02 DIAGNOSIS — H40022 Open angle with borderline findings, high risk, left eye: Secondary | ICD-10-CM | POA: Diagnosis not present

## 2022-10-02 DIAGNOSIS — H401131 Primary open-angle glaucoma, bilateral, mild stage: Secondary | ICD-10-CM | POA: Diagnosis not present

## 2022-10-06 DIAGNOSIS — R6889 Other general symptoms and signs: Secondary | ICD-10-CM | POA: Diagnosis not present

## 2022-10-09 DIAGNOSIS — Z23 Encounter for immunization: Secondary | ICD-10-CM | POA: Diagnosis not present

## 2022-10-29 DIAGNOSIS — E7800 Pure hypercholesterolemia, unspecified: Secondary | ICD-10-CM | POA: Diagnosis not present

## 2022-10-29 DIAGNOSIS — E039 Hypothyroidism, unspecified: Secondary | ICD-10-CM | POA: Diagnosis not present

## 2022-11-03 DIAGNOSIS — E039 Hypothyroidism, unspecified: Secondary | ICD-10-CM | POA: Diagnosis not present

## 2022-11-03 DIAGNOSIS — Z0000 Encounter for general adult medical examination without abnormal findings: Secondary | ICD-10-CM | POA: Diagnosis not present

## 2022-11-03 DIAGNOSIS — G2581 Restless legs syndrome: Secondary | ICD-10-CM | POA: Diagnosis not present

## 2022-11-03 DIAGNOSIS — M2011 Hallux valgus (acquired), right foot: Secondary | ICD-10-CM | POA: Diagnosis not present

## 2022-11-03 DIAGNOSIS — I700 Atherosclerosis of aorta: Secondary | ICD-10-CM | POA: Diagnosis not present

## 2022-11-03 DIAGNOSIS — I6523 Occlusion and stenosis of bilateral carotid arteries: Secondary | ICD-10-CM | POA: Diagnosis not present

## 2022-11-03 DIAGNOSIS — I2510 Atherosclerotic heart disease of native coronary artery without angina pectoris: Secondary | ICD-10-CM | POA: Diagnosis not present

## 2022-11-03 DIAGNOSIS — M2012 Hallux valgus (acquired), left foot: Secondary | ICD-10-CM | POA: Diagnosis not present

## 2022-11-03 DIAGNOSIS — K219 Gastro-esophageal reflux disease without esophagitis: Secondary | ICD-10-CM | POA: Diagnosis not present

## 2022-11-03 DIAGNOSIS — I447 Left bundle-branch block, unspecified: Secondary | ICD-10-CM | POA: Diagnosis not present

## 2022-11-03 DIAGNOSIS — I350 Nonrheumatic aortic (valve) stenosis: Secondary | ICD-10-CM | POA: Diagnosis not present

## 2022-11-04 DIAGNOSIS — I6523 Occlusion and stenosis of bilateral carotid arteries: Secondary | ICD-10-CM | POA: Diagnosis not present

## 2022-11-05 ENCOUNTER — Encounter: Payer: Self-pay | Admitting: Family Medicine

## 2022-11-05 ENCOUNTER — Ambulatory Visit (INDEPENDENT_AMBULATORY_CARE_PROVIDER_SITE_OTHER): Payer: Medicare Other | Admitting: Family Medicine

## 2022-11-05 VITALS — BP 122/78 | HR 69 | Ht 69.0 in | Wt 191.0 lb

## 2022-11-05 DIAGNOSIS — M21612 Bunion of left foot: Secondary | ICD-10-CM

## 2022-11-05 DIAGNOSIS — M21611 Bunion of right foot: Secondary | ICD-10-CM

## 2022-11-05 DIAGNOSIS — M21619 Bunion of unspecified foot: Secondary | ICD-10-CM | POA: Diagnosis not present

## 2022-11-05 NOTE — Patient Instructions (Signed)
Thank you for coming in today.   Use a toe space for bunions.   Consider a turf toe insole.   Get a Steel Turf Toe insole.  Do a Microbiologist for [employer]

## 2022-11-05 NOTE — Progress Notes (Signed)
   I, Evan Myers, CMA acting as a scribe for Evan Graham, MD.  Evan Myers is a 82 y.o. male who presents to [facility] today for bilat foot pain. Pt was seen previously by Dr. Denyse Myers on 2021 for lumbar radiculopathy.  Today, pt c/o bilat foot pain. Pt locates pain to the great toes. Notes dx of bunions. Was having daily sx, now sx are intermittent, exacerbated by walking for exercise. Does wear insoles with arch support.   Treatments tried: arch support  Pertinent review of systems: No fevers or chills.  Relevant historical information: Aortic valvular stenosis.   Exam:  BP 122/78   Pulse 69   Ht 5\' 9"  (1.753 m)   Wt 191 lb (86.6 kg)   SpO2 98%   BMI 28.21 kg/m  General: Well Developed, well nourished, and in no acute distress.   MSK: Feet bilaterally bunion formation present both sides with some toe crossover.  Nontender.  Decreased great toe dorsiflexion bilaterally.    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  Overall he is doing pretty well with not a lot of bothersome symptoms.  He will note some pain with prolonged walking longer than a few miles.  He has found comfortable shoes and is not having significant skin issues from the toe crossover.  It is hard for me to recommend surgery in this case.  Recommend toe spacers in good supportive footwear.  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  If all that does not work well enough then surgery may be reasonable.  He is not excited about the idea of surgery as it would be a long recovery.  Recheck as needed.   PDMP not reviewed this encounter. No orders of the defined types were placed in this encounter.  No orders of the defined types were placed in this encounter.    Discussed warning signs or symptoms. Please see discharge instructions. Patient expresses understanding.   The above documentation has been  reviewed and is accurate and complete Evan Myers, M.D.

## 2022-12-15 DIAGNOSIS — E7800 Pure hypercholesterolemia, unspecified: Secondary | ICD-10-CM | POA: Diagnosis not present

## 2022-12-15 DIAGNOSIS — Z23 Encounter for immunization: Secondary | ICD-10-CM | POA: Diagnosis not present

## 2023-02-04 DIAGNOSIS — N138 Other obstructive and reflux uropathy: Secondary | ICD-10-CM | POA: Diagnosis not present

## 2023-04-23 DIAGNOSIS — B880 Other acariasis: Secondary | ICD-10-CM | POA: Diagnosis not present

## 2023-04-23 DIAGNOSIS — H401310 Pigmentary glaucoma, right eye, stage unspecified: Secondary | ICD-10-CM | POA: Diagnosis not present

## 2023-04-23 DIAGNOSIS — H524 Presbyopia: Secondary | ICD-10-CM | POA: Diagnosis not present

## 2023-04-23 DIAGNOSIS — D3132 Benign neoplasm of left choroid: Secondary | ICD-10-CM | POA: Diagnosis not present

## 2023-04-23 DIAGNOSIS — H40022 Open angle with borderline findings, high risk, left eye: Secondary | ICD-10-CM | POA: Diagnosis not present

## 2023-04-23 DIAGNOSIS — H01004 Unspecified blepharitis left upper eyelid: Secondary | ICD-10-CM | POA: Diagnosis not present

## 2023-04-23 DIAGNOSIS — H01001 Unspecified blepharitis right upper eyelid: Secondary | ICD-10-CM | POA: Diagnosis not present

## 2023-05-28 ENCOUNTER — Ambulatory Visit: Payer: Medicare Other | Attending: Cardiovascular Disease | Admitting: Cardiovascular Disease

## 2023-05-28 ENCOUNTER — Encounter: Payer: Self-pay | Admitting: Cardiovascular Disease

## 2023-05-28 VITALS — BP 124/62 | HR 84 | Ht 69.0 in | Wt 192.0 lb

## 2023-05-28 DIAGNOSIS — I2510 Atherosclerotic heart disease of native coronary artery without angina pectoris: Secondary | ICD-10-CM | POA: Diagnosis not present

## 2023-05-28 DIAGNOSIS — E785 Hyperlipidemia, unspecified: Secondary | ICD-10-CM | POA: Diagnosis not present

## 2023-05-28 DIAGNOSIS — I350 Nonrheumatic aortic (valve) stenosis: Secondary | ICD-10-CM | POA: Diagnosis not present

## 2023-05-28 DIAGNOSIS — I447 Left bundle-branch block, unspecified: Secondary | ICD-10-CM | POA: Diagnosis not present

## 2023-05-28 NOTE — Progress Notes (Signed)
 Cardiology Consultation:   Patient ID: Evan Myers MRN: [id number]; DOB: [birth year]  Admit date: (Not on file) Date of Consult: 05/28/2023  Primary Care Provider: Imelda Man, MD [facility] Cardiologist: Evan Myers [facility] Electrophysiologist:  None    Patient Profile:   Evan Myers is a 83 y.o. male with a hx of mild aortic stenosis, hyperlipidemia, LBBB, elevated coronary calcium score 498 (57 percentile for age and gender), but without any coronary disease on remote coronary angiography in 2006.  History of Present Illness:   Evan Myers remains asymptomatic.  He continues to be physically active, taking care of all of his own yard work and denies any exertional angina/dyspnea/syncope or dizziness.  He has had a murmur for the better part of his adult life.  His most recent echocardiogram from October 2021 showed mild-moderate aortic valve stenosis (mean gradient 17 mmHg, estimated AVA area 1.2 cm, dimensionless index 0.29).  He has preserved left ventricular systolic function and normal global longitudinal left ventricular strain.  He developed a new LBBB in 2021 but this was not present on ECGs performed today in 2024 suggesting it is a rate related abnormality.  He has normal caliber of the aortic root and ascending aorta by echo and by noncontrast CT of the chest.  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  He was intolerant to more potent statins when these were tried in the past.  He underwent a coronary calcium score which showed average coronary plaque (even though the coronary calcium score was elevated at 498, this places him in the 57th percentile for age and gender).  Labs performed by Dr. Lucina Myers in October showed an LDL cholesterol of 92 and Zetia was started.  He does not have diabetes mellitus,  known CAD (normal coronary angiography 2006) or PAD, history of stroke, congestive heart failure, or history of cardiac arrhythmia.   Past Medical History:  Diagnosis Date   Actinic keratosis    Adenomatous polyp of colon    Allergic rhinitis    Aortic stenosis    GERD (gastroesophageal reflux disease)    Heart murmur    Hemorrhoids    HOH (hard of hearing)    Hyperhomocystinemia    Hyperlipidemia    Mild CAD    via cath in 2008 (per office notes)   Polymyalgia rheumatica (HCC)    Restless leg syndrome    Tinnitus    Urticaria    Varicose veins of left lower extremity     Past Surgical History:  Procedure Laterality Date   CARDIAC CATHETERIZATION     NM MYOCAR PERF WALL MOTION  2011   bruce myoview - normal perfusion, EF 58%   TONSILLECTOMY  1949   TRANSTHORACIC ECHOCARDIOGRAM  2011   EF=>55%; mild mitral annular calcification, mild MR; mild TR, normal RVSP; mild calcification of AV leaflets and mild valvular AS; aortic root sclerosis/calcification   VASECTOMY  1975     Home Medications:  Prior to Admission medications   Medication Sig Start Date End Date Taking? Authorizing Provider  alfuzosin (UROXATRAL) 10 MG 24 hr tablet Take 10 mg by mouth daily.    Yes [provider]  aspirin 81 MG tablet Take 81 mg by mouth daily.   Yes [provider]  Carboxymethylcellulose Sodium (THERATEARS) 0.25 % SOLN Apply to eye as needed.   Yes [provider]  cetirizine (ZYRTEC)  10 MG tablet Take 10 mg by mouth daily.   Yes [provider]  gabapentin  (NEURONTIN ) 300 MG capsule Take 1 capsule (300 mg total) by mouth 3 (three) times daily as needed (nerve pain). 05/09/19  Yes Corey, Evan S, MD  levothyroxine (SYNTHROID) 50 MCG tablet Take 50 mcg by mouth daily before breakfast.   Yes [provider]  pantoprazole (PROTONIX) 40 MG tablet Take 40 mg by mouth daily.   Yes [provider]  Pitavastatin Calcium (LIVALO) 2 MG TABS Take 1 mg  by mouth daily.   Yes [provider]    Allergies:    Allergies  Allergen Reactions   Brinzolamide-Brimonidine Other (See Comments)    Pt states eye drops highly Irritates his eyes.   Doxycycline Other (See Comments)    constipation   Sulfamethoxazole-Trimethoprim Other (See Comments)    Pt states bactrim elevates his creatinine      Family History:    Family History  Problem Relation Age of Onset   Hypertension Mother    Hyperlipidemia Mother    Heart failure Mother    Skin cancer Mother    Heart attack Father 44   Hypertension Father    COPD Father    Emphysema Father    Stroke Father    Hypotension Maternal Grandmother    Diabetes Paternal Grandmother    Nephrolithiasis Brother        half - sibling   Hypertension Brother        half - sibling   Hyperlipidemia Brother        half - sibling   Hypertension Sister    SIDS Brother        9 months, half - sibling     ROS:  Please see the history of present illness.  All other ROS reviewed and negative.     Physical Exam/Data:   Vitals:   05/28/23 0848  BP: 124/62  Pulse: 84  SpO2: 96%  Weight: 87.1 kg  Height: 5\' 9"  (1.753 m)   @IOBRIEF @    05/28/2023    8:48 AM 11/05/2022    2:22 PM 09/13/2022    2:38 PM  Last 3 Weights  Weight (lbs) 192 lb 191 lb 185 lb  Weight (kg) 87.091 kg 86.637 kg 83.915 kg     Body mass index is 28.35 kg/m.    General: Alert, oriented x3, no distress, appears relatively lean and fit and younger than stated age Head: no evidence of trauma, PERRL, EOMI, no exophtalmos or lid lag, no myxedema, no xanthelasma; normal ears, nose and oropharynx Neck: normal jugular venous pulsations and no hepatojugular reflux; brisk carotid pulses without delay and no carotid bruits Chest: clear to auscultation, no signs of consolidation by percussion or palpation, normal fremitus, symmetrical and full respiratory excursions Cardiovascular: normal position and quality of the apical  impulse, regular rhythm, normal first heart sound and very soft aortic component of the second heart sound, 3/6 mid peaking aortic ejection murmur, no diastolic murmurs, rubs or gallops Abdomen: no tenderness or distention, no masses by palpation, no abnormal pulsatility or arterial bruits, normal bowel sounds, no hepatosplenomegaly Extremities: no clubbing, cyanosis or edema; 2+ radial, ulnar and brachial pulses bilaterally; 2+ right femoral, posterior tibial and dorsalis pedis pulses; 2+ left femoral, posterior tibial and dorsalis pedis pulses; no subclavian or femoral bruits Neurological: grossly nonfocal Psych: Normal mood and affect    EKG:    EKG Interpretation Date/Time:  Friday May 28 2023 08:59:09 EDT  Ventricular Rate:  74 PR Interval:  190 QRS Duration:  110 QT Interval:  384 QTC Calculation: 426 R Axis:   31  Text Interpretation: Normal sinus rhythm with sinus arrhythmia Normal ECG No previous ECGs available Confirmed by Caci Orren ([id number]) on 05/28/2023 9:28:31 AM          Relevant CV Studies: Reviewed coronary calcium score from October 2022 (score 498, 57th percentile)   echocardiogram October 2021  1. Septal dyskinesis consistent with bundle branch block. Left  ventricular ejection fraction, by estimation, is 60 to 65%. The left  ventricle has normal function. The left ventricle has no regional wall  motion abnormalities. Left ventricular diastolic  parameters are consistent with Grade I diastolic dysfunction (impaired  relaxation). The average left ventricular global longitudinal strain is  -20.6 %. The global longitudinal strain is normal.   2. Right ventricular systolic function is normal. The right ventricular  size is normal. There is normal pulmonary artery systolic pressure.   3. Left atrial size was mildly dilated.   4. The mitral valve is normal in structure. Trivial mitral valve  regurgitation. No evidence of mitral stenosis.   5. The aortic valve  is normal in structure. There is moderate  calcification of the aortic valve. There is moderate thickening of the  aortic valve. Aortic valve regurgitation is mild. Mild aortic valve  stenosis. Aortic valve area, by VTI measures 1.22  cm. Aortic valve mean gradient measures 16.7 mmHg. Aortic valve Vmax  measures 2.67 m/Myers.   6. The inferior vena cava is normal in size with greater than 50%  respiratory variability, suggesting right atrial pressure of 3 mmHg.   Comparison(Myers): 09/20/13 EF 60-65%.   Laboratory Data:  10/22/2020 Cholesterol 155, HDL 51, triglycerides 55 TSH 2.29  10/29/2022  Cholesterol 158, triglycerides 66, HDL 53, LDL 92  Radiology/Studies:  No results found.  Assessment and Plan:   1. Nonrheumatic aortic valve stenosis   2. LBBB (left bundle branch block)   3. Coronary artery disease involving native coronary artery of native heart without angina pectoris   4. Hyperlipidemia LDL goal <70        AS: He remains asymptomatic, but his physical exam suggest worsening aortic stenosis.  Repeat echocardiogram.  Asked him to alert us  if he develops symptoms of angina/dyspnea/syncope with exertion.  We reviewed the likelihood that he will eventually develop severe aortic stenosis that requires either surgical or transarterial AVR and reviewed the reason we would choose warfarin versus the other. CAD: Asymptomatic despite the fact that he is physically active.  His calcium score placed him in the 57 percentile only slightly worse in the average gentleman his age.  He will require coronary angiography as part of his workup for AVR in the future, but there is no reason to pursue this at this time unless he develops symptoms. LBBB: This appears to be an intermittent, possibly rate related abnormality.  He has normal left ventricular systolic function has not had any symptoms of high-grade AV block. HLP: Labs followed by Dr. Schuyler Custard.  Most recent LDL cholesterol was a little high  at 92 on pitavastatin, but that was before he started taking ezetimibe.  Ideally would like to keep the LDL less than 70.    Signed, Luana Rumple, MD  05/28/2023 10:15 AM

## 2023-05-28 NOTE — Patient Instructions (Signed)
 Medication Instructions:  No changes *If you need a refill on your cardiac medications before your next appointment, please call your pharmacy*  Testing/Procedures: Your physician has requested that you have an echocardiogram. Echocardiography is a painless test that uses sound waves to create images of your heart. It provides your doctor with information about the size and shape of your heart and how well your heart's chambers and valves are working. This procedure takes approximately one hour. There are no restrictions for this procedure. Please do NOT wear cologne, perfume, aftershave, or lotions (deodorant is allowed). Please arrive 15 minutes prior to your appointment time.  Please note: We ask at that you not bring children with you during ultrasound (echo/ vascular) testing. Due to room size and safety concerns, children are not allowed in the ultrasound rooms during exams. Our front office staff cannot provide observation of children in our lobby area while testing is being conducted. An adult accompanying a patient to their appointment will only be allowed in the ultrasound room at the discretion of the ultrasound technician under special circumstances. We apologize for any inconvenience.   Follow-Up: At [facility], you and your health needs are our priority.  As part of our continuing mission to provide you with exceptional heart care, our providers are all part of one team.  This team includes your primary Cardiologist (physician) and Advanced Practice Providers or APPs (Physician Assistants and Nurse Practitioners) who all work together to provide you with the care you need, when you need it.  Your next appointment:   1 year(s)  Provider:   Luana Rumple, MD    We recommend signing up for the patient portal called "MyChart".  Sign up information is provided on this After Visit Summary.  MyChart is used to connect with patients for Virtual Visits (Telemedicine).  Patients  are able to view lab/test results, encounter notes, upcoming appointments, etc.  Non-urgent messages can be sent to your provider as well.   To learn more about what you can do with MyChart, go to [email or web address].

## 2023-06-24 DIAGNOSIS — H01004 Unspecified blepharitis left upper eyelid: Secondary | ICD-10-CM | POA: Diagnosis not present

## 2023-06-24 DIAGNOSIS — B880 Other acariasis: Secondary | ICD-10-CM | POA: Diagnosis not present

## 2023-06-24 DIAGNOSIS — H401310 Pigmentary glaucoma, right eye, stage unspecified: Secondary | ICD-10-CM | POA: Diagnosis not present

## 2023-06-24 DIAGNOSIS — H01001 Unspecified blepharitis right upper eyelid: Secondary | ICD-10-CM | POA: Diagnosis not present

## 2023-06-30 ENCOUNTER — Ambulatory Visit (HOSPITAL_COMMUNITY)
Admission: RE | Admit: 2023-06-30 | Discharge: 2023-06-30 | Disposition: A | Discharge status: Home / Self Care | Source: Ambulatory Visit | Attending: Cardiology | Admitting: Cardiology

## 2023-06-30 DIAGNOSIS — I350 Nonrheumatic aortic (valve) stenosis: Secondary | ICD-10-CM | POA: Diagnosis not present

## 2023-07-01 ENCOUNTER — Ambulatory Visit: Payer: Self-pay | Admitting: Cardiovascular Disease

## 2023-07-01 DIAGNOSIS — I350 Nonrheumatic aortic (valve) stenosis: Secondary | ICD-10-CM

## 2023-07-01 LAB — ECHOCARDIOGRAM COMPLETE
AR max vel: 1.04 cm2
AV Area VTI: 0.99 cm2
AV Area mean vel: 0.85 cm2
AV Mean grad: 30 mmHg
AV Peak grad: 47.3 mmHg
Ao pk vel: 3.44 m/s
Area-P 1/2: 2.5 cm2
S' Lateral: 2.98 cm

## 2023-10-01 DIAGNOSIS — Z23 Encounter for immunization: Secondary | ICD-10-CM | POA: Diagnosis not present

## 2023-11-04 DIAGNOSIS — E7800 Pure hypercholesterolemia, unspecified: Secondary | ICD-10-CM | POA: Diagnosis not present

## 2023-11-04 DIAGNOSIS — E039 Hypothyroidism, unspecified: Secondary | ICD-10-CM | POA: Diagnosis not present

## 2023-11-04 LAB — LAB REPORT - SCANNED
EGFR: 69
TSH: 2.78 (ref 0.41–5.90)

## 2023-11-09 ENCOUNTER — Other Ambulatory Visit: Payer: Self-pay | Admitting: Internal Medicine

## 2023-11-09 DIAGNOSIS — I350 Nonrheumatic aortic (valve) stenosis: Secondary | ICD-10-CM | POA: Diagnosis not present

## 2023-11-09 DIAGNOSIS — I447 Left bundle-branch block, unspecified: Secondary | ICD-10-CM | POA: Diagnosis not present

## 2023-11-09 DIAGNOSIS — R0989 Other specified symptoms and signs involving the circulatory and respiratory systems: Secondary | ICD-10-CM | POA: Diagnosis not present

## 2023-11-09 DIAGNOSIS — I700 Atherosclerosis of aorta: Secondary | ICD-10-CM | POA: Diagnosis not present

## 2023-11-09 DIAGNOSIS — E041 Nontoxic single thyroid nodule: Secondary | ICD-10-CM

## 2023-11-09 DIAGNOSIS — Z23 Encounter for immunization: Secondary | ICD-10-CM | POA: Diagnosis not present

## 2023-11-09 DIAGNOSIS — I6523 Occlusion and stenosis of bilateral carotid arteries: Secondary | ICD-10-CM | POA: Diagnosis not present

## 2023-11-09 DIAGNOSIS — I2510 Atherosclerotic heart disease of native coronary artery without angina pectoris: Secondary | ICD-10-CM | POA: Diagnosis not present

## 2023-11-09 DIAGNOSIS — E039 Hypothyroidism, unspecified: Secondary | ICD-10-CM | POA: Diagnosis not present

## 2023-11-09 DIAGNOSIS — Z0000 Encounter for general adult medical examination without abnormal findings: Secondary | ICD-10-CM | POA: Diagnosis not present

## 2023-11-09 DIAGNOSIS — G4733 Obstructive sleep apnea (adult) (pediatric): Secondary | ICD-10-CM | POA: Diagnosis not present

## 2023-11-09 DIAGNOSIS — K219 Gastro-esophageal reflux disease without esophagitis: Secondary | ICD-10-CM | POA: Diagnosis not present

## 2023-11-11 ENCOUNTER — Ambulatory Visit
Admission: RE | Admit: 2023-11-11 | Discharge: 2023-11-11 | Disposition: A | Discharge status: Home / Self Care | Source: Ambulatory Visit | Attending: Internal Medicine | Admitting: Internal Medicine

## 2023-11-11 DIAGNOSIS — E041 Nontoxic single thyroid nodule: Secondary | ICD-10-CM | POA: Diagnosis not present

## 2023-11-24 DIAGNOSIS — H01001 Unspecified blepharitis right upper eyelid: Secondary | ICD-10-CM | POA: Diagnosis not present

## 2023-11-24 DIAGNOSIS — H40022 Open angle with borderline findings, high risk, left eye: Secondary | ICD-10-CM | POA: Diagnosis not present

## 2023-11-24 DIAGNOSIS — H01004 Unspecified blepharitis left upper eyelid: Secondary | ICD-10-CM | POA: Diagnosis not present

## 2023-11-24 DIAGNOSIS — H401310 Pigmentary glaucoma, right eye, stage unspecified: Secondary | ICD-10-CM | POA: Diagnosis not present

## 2023-12-29 ENCOUNTER — Ambulatory Visit: Payer: Self-pay | Admitting: Cardiovascular Disease

## 2023-12-29 ENCOUNTER — Ambulatory Visit (HOSPITAL_COMMUNITY)
Admission: RE | Admit: 2023-12-29 | Discharge: 2023-12-29 | Disposition: A | Discharge status: Home / Self Care | Source: Ambulatory Visit | Attending: Cardiology | Admitting: Cardiology

## 2023-12-29 DIAGNOSIS — I350 Nonrheumatic aortic (valve) stenosis: Secondary | ICD-10-CM | POA: Insufficient documentation

## 2023-12-29 LAB — ECHOCARDIOGRAM COMPLETE
AR max vel: 0.98 cm2
AV Area VTI: 0.93 cm2
AV Area mean vel: 1 cm2
AV Mean grad: 31 mmHg
AV Peak grad: 56.9 mmHg
Ao pk vel: 3.77 m/s
Area-P 1/2: 2.88 cm2
P 1/2 time: 641 ms
S' Lateral: 2.6 cm

## 2024-01-03 ENCOUNTER — Observation Stay (HOSPITAL_COMMUNITY)
Admission: EM | Admit: 2024-01-03 | Discharge: 2024-01-04 | Disposition: A | Attending: Internal Medicine | Admitting: Internal Medicine

## 2024-01-03 ENCOUNTER — Emergency Department (HOSPITAL_COMMUNITY)

## 2024-01-03 ENCOUNTER — Other Ambulatory Visit: Payer: Self-pay

## 2024-01-03 DIAGNOSIS — J101 Influenza due to other identified influenza virus with other respiratory manifestations: Secondary | ICD-10-CM | POA: Diagnosis present

## 2024-01-03 DIAGNOSIS — E039 Hypothyroidism, unspecified: Secondary | ICD-10-CM | POA: Insufficient documentation

## 2024-01-03 DIAGNOSIS — A419 Sepsis, unspecified organism: Secondary | ICD-10-CM | POA: Insufficient documentation

## 2024-01-03 DIAGNOSIS — J111 Influenza due to unidentified influenza virus with other respiratory manifestations: Principal | ICD-10-CM

## 2024-01-03 DIAGNOSIS — E785 Hyperlipidemia, unspecified: Secondary | ICD-10-CM | POA: Diagnosis present

## 2024-01-03 DIAGNOSIS — R0902 Hypoxemia: Secondary | ICD-10-CM

## 2024-01-03 LAB — I-STAT CG4 LACTIC ACID, ED: Lactic Acid, Venous: 1.3 mmol/L (ref 0.5–1.9)

## 2024-01-03 LAB — CBC WITH DIFFERENTIAL/PLATELET
Abs Immature Granulocytes: 0.03 K/uL (ref 0.00–0.07)
Basophils Absolute: 0 K/uL (ref 0.0–0.1)
Basophils Relative: 0 %
Eosinophils Absolute: 0 K/uL (ref 0.0–0.5)
Eosinophils Relative: 0 %
HCT: 43.4 % (ref 39.0–52.0)
Hemoglobin: 15.1 g/dL (ref 13.0–17.0)
Immature Granulocytes: 0 %
Lymphocytes Relative: 8 %
Lymphs Abs: 0.7 K/uL (ref 0.7–4.0)
MCH: 32 pg (ref 26.0–34.0)
MCHC: 34.8 g/dL (ref 30.0–36.0)
MCV: 91.9 fL (ref 80.0–100.0)
Monocytes Absolute: 0.9 K/uL (ref 0.1–1.0)
Monocytes Relative: 10 %
Neutro Abs: 7.5 K/uL (ref 1.7–7.7)
Neutrophils Relative %: 82 %
Platelets: 154 K/uL (ref 150–400)
RBC: 4.72 MIL/uL (ref 4.22–5.81)
RDW: 14 % (ref 11.5–15.5)
WBC: 9.1 K/uL (ref 4.0–10.5)
nRBC: 0 % (ref 0.0–0.2)

## 2024-01-03 LAB — COMPREHENSIVE METABOLIC PANEL WITH GFR
ALT: 18 U/L (ref 0–44)
AST: 27 U/L (ref 15–41)
Albumin: 4.2 g/dL (ref 3.5–5.0)
Alkaline Phosphatase: 50 U/L (ref 38–126)
Anion gap: 12 (ref 5–15)
BUN: 17 mg/dL (ref 8–23)
CO2: 23 mmol/L (ref 22–32)
Calcium: 9.3 mg/dL (ref 8.9–10.3)
Chloride: 102 mmol/L (ref 98–111)
Creatinine, Ser: 1.18 mg/dL (ref 0.61–1.24)
GFR, Estimated: >60 mL/min (ref >60)
Glucose, Bld: 131 mg/dL — ABNORMAL HIGH (ref 70–99)
Potassium: 3.7 mmol/L (ref 3.5–5.1)
Sodium: 137 mmol/L (ref 135–145)
Total Bilirubin: 0.5 mg/dL (ref 0.0–1.2)
Total Protein: 7.5 g/dL (ref 6.5–8.1)

## 2024-01-03 LAB — PROTIME-INR
INR: 1.1 (ref 0.8–1.2)
Prothrombin Time: 14.7 s (ref 11.4–15.2)

## 2024-01-03 LAB — RESP PANEL BY RT-PCR (RSV, FLU A&B, COVID)  RVPGX2
Influenza A by PCR: POSITIVE — AB
Influenza B by PCR: NEGATIVE
Resp Syncytial Virus by PCR: NEGATIVE
SARS Coronavirus 2 by RT PCR: NEGATIVE

## 2024-01-03 MED ORDER — BENZONATATE 100 MG PO CAPS
100.0000 mg | ORAL_CAPSULE | Freq: Three times a day (TID) | ORAL | Status: DC | PRN
  Administered 2024-01-03: 100 mg via ORAL
  Filled 2024-01-03: qty 1

## 2024-01-03 MED ORDER — LACTATED RINGERS IV BOLUS (SEPSIS)
2000.0000 mL | Freq: Once | INTRAVENOUS | Status: AC
  Administered 2024-01-03: 2000 mL via INTRAVENOUS

## 2024-01-03 MED ORDER — LACTATED RINGERS IV SOLN: INTRAVENOUS | Status: AC

## 2024-01-03 MED ORDER — ENOXAPARIN SODIUM 40 MG/0.4ML IJ SOSY
40.0000 mg | PREFILLED_SYRINGE | INTRAMUSCULAR | Status: DC
  Administered 2024-01-03: 40 mg via SUBCUTANEOUS
  Filled 2024-01-03: qty 0.4

## 2024-01-03 MED ORDER — PRAVASTATIN SODIUM 40 MG PO TABS: 40.0000 mg | ORAL_TABLET | Freq: Every day | ORAL | Status: DC

## 2024-01-03 MED ORDER — TRAZODONE HCL 50 MG PO TABS
25.0000 mg | ORAL_TABLET | Freq: Every evening | ORAL | Status: DC | PRN
  Administered 2024-01-03: 25 mg via ORAL
  Filled 2024-01-03: qty 1

## 2024-01-03 MED ORDER — ALBUTEROL SULFATE (2.5 MG/3ML) 0.083% IN NEBU: 2.5000 mg | INHALATION_SOLUTION | RESPIRATORY_TRACT | Status: DC | PRN

## 2024-01-03 MED ORDER — ACETAMINOPHEN 650 MG RE SUPP: 650.0000 mg | Freq: Four times a day (QID) | RECTAL | Status: DC | PRN

## 2024-01-03 MED ORDER — ONDANSETRON HCL 4 MG/2ML IJ SOLN: 4.0000 mg | Freq: Four times a day (QID) | INTRAMUSCULAR | Status: DC | PRN

## 2024-01-03 MED ORDER — PANTOPRAZOLE SODIUM 40 MG PO TBEC
40.0000 mg | DELAYED_RELEASE_TABLET | Freq: Every day | ORAL | Status: DC
  Administered 2024-01-03 – 2024-01-04 (×2): 40 mg via ORAL
  Filled 2024-01-03 (×2): qty 1

## 2024-01-03 MED ORDER — LORATADINE 10 MG PO TABS
10.0000 mg | ORAL_TABLET | Freq: Every day | ORAL | Status: DC
  Administered 2024-01-03 – 2024-01-04 (×2): 10 mg via ORAL
  Filled 2024-01-03 (×2): qty 1

## 2024-01-03 MED ORDER — SODIUM CHLORIDE 0.9 % IV SOLN
2.0000 g | Freq: Once | INTRAVENOUS | Status: AC
  Administered 2024-01-03: 2 g via INTRAVENOUS
  Filled 2024-01-03: qty 20

## 2024-01-03 MED ORDER — DORZOLAMIDE HCL 2 % OP SOLN
1.0000 [drp] | Freq: Two times a day (BID) | OPHTHALMIC | Status: DC
  Administered 2024-01-04: 1 [drp] via OPHTHALMIC
  Filled 2024-01-03: qty 10

## 2024-01-03 MED ORDER — ONDANSETRON HCL 4 MG PO TABS: 4.0000 mg | ORAL_TABLET | Freq: Four times a day (QID) | ORAL | Status: DC | PRN

## 2024-01-03 MED ORDER — ASPIRIN 81 MG PO CHEW
81.0000 mg | CHEWABLE_TABLET | Freq: Every day | ORAL | Status: DC
  Administered 2024-01-03 – 2024-01-04 (×2): 81 mg via ORAL
  Filled 2024-01-03 (×2): qty 1

## 2024-01-03 MED ORDER — EZETIMIBE 10 MG PO TABS
10.0000 mg | ORAL_TABLET | Freq: Every day | ORAL | Status: DC
  Administered 2024-01-03 – 2024-01-04 (×2): 10 mg via ORAL
  Filled 2024-01-03 (×2): qty 1

## 2024-01-03 MED ORDER — LEVOTHYROXINE SODIUM 50 MCG PO TABS
50.0000 ug | ORAL_TABLET | Freq: Every day | ORAL | Status: DC
  Administered 2024-01-04: 50 ug via ORAL
  Filled 2024-01-03: qty 1

## 2024-01-03 MED ORDER — LATANOPROST 0.005 % OP SOLN
1.0000 [drp] | Freq: Every day | OPHTHALMIC | Status: DC
  Filled 2024-01-03: qty 2.5

## 2024-01-03 MED ORDER — ACETAMINOPHEN 325 MG PO TABS
650.0000 mg | ORAL_TABLET | Freq: Once | ORAL | Status: AC | PRN
  Administered 2024-01-03: 650 mg via ORAL
  Filled 2024-01-03: qty 2

## 2024-01-03 MED ORDER — ACETAMINOPHEN 325 MG PO TABS
650.0000 mg | ORAL_TABLET | Freq: Four times a day (QID) | ORAL | Status: DC | PRN
  Administered 2024-01-03 (×2): 650 mg via ORAL
  Filled 2024-01-03 (×2): qty 2

## 2024-01-03 MED ORDER — SODIUM CHLORIDE 0.9 % IV SOLN
500.0000 mg | Freq: Once | INTRAVENOUS | Status: DC
  Administered 2024-01-03: 500 mg via INTRAVENOUS
  Filled 2024-01-03: qty 5

## 2024-01-03 NOTE — Plan of Care (Signed)
  Problem: Health Behavior/Discharge Planning: Goal: Ability to manage health-related needs will improve Outcome: Progressing   Problem: Clinical Measurements: Goal: Ability to maintain clinical measurements within normal limits will improve Outcome: Progressing   Problem: Activity: Goal: Risk for activity intolerance will decrease Outcome: Progressing   

## 2024-01-03 NOTE — ED Provider Notes (Signed)
 [place] EMERGENCY DEPARTMENT AT [facility] Provider Note   CSN: [id number] Arrival date & time: 01/03/24  9284     Patient presents with: No chief complaint on file.   Evan Myers is a 83 y.o. male.   This is an 83 year old male who is here today for sore throat, shortness of breath and lightheadedness.  Patient reports that his symptoms began on Thursday, progressively worsened.  States that he is usually very healthy, is able to do all of his usual activities, and going through his chart appears to be some and also does yard work.  He states that this morning he was feeling lightheaded, had his wife take his blood pressure and it was low.  Patient's wife also has had similar symptoms at home.        Prior to Admission medications   Medication Sig Start Date End Date Taking? Authorizing Provider  alfuzosin (UROXATRAL) 10 MG 24 hr tablet Take 10 mg by mouth daily.     [provider]  aspirin  81 MG tablet Take 81 mg by mouth daily.    [provider]  Carboxymethylcellulose Sodium (THERATEARS) 0.25 % SOLN Apply to eye as needed.    [provider]  cetirizine (ZYRTEC) 10 MG tablet Take 10 mg by mouth daily.    [provider]  ciprofloxacin (CIPRO) 500 MG tablet Take 500 mg by mouth as needed. Patient not taking: Reported on 05/28/2023 12/15/18   [provider]  ezetimibe  (ZETIA ) 10 MG tablet Take 10 mg by mouth daily.    [provider]  latanoprost  (XALATAN ) 0.005 % ophthalmic solution Place 1 drop into the right eye at bedtime. 04/24/21   [provider]  levothyroxine  (SYNTHROID ) 50 MCG tablet Take 50 mcg by mouth daily before breakfast.    [provider]  LIVALO 4 MG TABS Take 0.5 mg by mouth daily.    [provider]  pantoprazole  (PROTONIX ) 40 MG tablet Take 40 mg by mouth daily.    [provider]  XDEMVY 0.25 % SOLN Place 10 mLs into both eyes 2 (two) times daily.  FOR 6 WEEKS 04/28/23   [provider]    Allergies: Brinzolamide-brimonidine, Doxycycline, and Sulfamethoxazole-trimethoprim    Review of Systems  Updated Vital Signs BP 105/68 (BP Location: Right Arm)   Pulse (!) 110   Temp (!) 101.3 F (38.5 C) (Oral)   Resp (!) 21   SpO2 (!) 89%   Physical Exam Vitals and nursing note reviewed.  Constitutional:      Appearance: He is not toxic-appearing.  HENT:     Head: Normocephalic and atraumatic.     Mouth/Throat:     Mouth: Mucous membranes are dry.  Eyes:     Pupils: Pupils are equal, round, and reactive to light.  Cardiovascular:     Rate and Rhythm: Tachycardia present.  Pulmonary:     Effort: Pulmonary effort is normal.     Breath sounds: Normal breath sounds.  Abdominal:     General: Abdomen is flat. There is no distension.     Palpations: Abdomen is soft.     Tenderness: There is no abdominal tenderness.  Musculoskeletal:        General: Normal range of motion.  Skin:    General: Skin is warm.  Neurological:     General: No focal deficit present.     Mental Status: He is alert.     (all labs ordered are listed, but  only abnormal results are displayed) Labs Reviewed  RESP PANEL BY RT-PCR (RSV, FLU A&B, COVID)  RVPGX2 - Abnormal; Notable for the following components:      Result Value   Influenza A by PCR POSITIVE (*)    All other components within normal limits  COMPREHENSIVE METABOLIC PANEL WITH GFR - Abnormal; Notable for the following components:   Glucose, Bld 131 (*)    All other components within normal limits  CULTURE, BLOOD (ROUTINE X 2)  CULTURE, BLOOD (ROUTINE X 2)  CBC WITH DIFFERENTIAL/PLATELET  PROTIME-INR  URINALYSIS, W/ REFLEX TO CULTURE (INFECTION SUSPECTED)  I-STAT CG4 LACTIC ACID, ED    EKG: EKG Interpretation Date/Time:  Monday January 03 2024 07:50:19 EST Ventricular Rate:  103 PR Interval:  186 QRS Duration:  155 QT Interval:  367 QTC Calculation: 481 R Axis:   78  Text  Interpretation: Sinus tachycardia Left bundle branch block Confirmed by Mannie Pac [id number]) on 01/03/2024 8:25:56 AM  Radiology: [facility] Chest Port 1 View Result Date: 01/03/2024 CLINICAL DATA:  Cough EXAM: PORTABLE CHEST - 1 VIEW COMPARISON:  None available. FINDINGS: Cardiomediastinal silhouette and pulmonary vasculature are within normal limits. Lungs are clear. IMPRESSION: No acute cardiopulmonary process. Electronically Signed   By: Aliene Lloyd M.D.   On: 01/03/2024 08:54     .Critical Care  Performed by: Mannie Pac DASEN, DO Authorized by: Mannie Pac DASEN, DO   Critical care provider statement:    Critical care time (minutes):  33   Critical care was necessary to treat or prevent imminent or life-threatening deterioration of the following conditions:  Respiratory failure   Critical care was time spent personally by me on the following activities:  Development of treatment plan with patient or surrogate, discussions with consultants, evaluation of patient's response to treatment, examination of patient, ordering and review of laboratory studies, ordering and review of radiographic studies, ordering and performing treatments and interventions, pulse oximetry, re-evaluation of patient's condition and review of old charts    Medications Ordered in the ED  lactated ringers  infusion ( Intravenous New Bag/Given 01/03/24 0817)  lactated ringers  bolus 2,000 mL (2,000 mLs Intravenous New Bag/Given 01/03/24 0837)  acetaminophen  (TYLENOL ) tablet 650 mg (650 mg Oral Given 01/03/24 0739)  cefTRIAXone  (ROCEPHIN ) 2 g in sodium chloride  0.9 % 100 mL IVPB (0 g Intravenous Stopped 01/03/24 0830)                                    Medical Decision Making 83 year old male here today with lightheadedness, hypotension, fatigue, URI symptoms.  Differential diagnoses include URI, dehydration, sepsis, pneumonia.  Plan-patient actually looks quite a bit better than his vital signs would indicate, however  he does meet sepsis criteria.  Out of concern for fluid overload, will provide the patient with 2 L of IV fluids and reassess.  I have provided him with some Tylenol  for fever.  Patient does not require home O2, was saturating 89% in triage.  Antibiotics ordered.  Reassessment 8:30 AM-patient's influenza positive.  Likely the cause of his symptoms.  He is requiring supplemental O2 at this time.  Patient received Rocephin , have discontinued azithromycin  given positive influenza.  This patient does not have sepsis.  Reassessment at 9 AM-my dependent review the patient's chest x-ray shows no pneumonia.  Blood pressure improving with IV fluids.  Will admit patient to hospitalist for influenza, hypoxia.  Patient's pending chest x-ray, ultimately will  require admission for hypoxia, weakness in the setting of influenza.  Amount and/or Complexity of Data Reviewed Labs: ordered. Radiology: ordered.  Risk OTC drugs. Prescription drug management. Decision regarding hospitalization.       Final diagnoses:  Influenza  Hypoxia    ED Discharge Orders     None          Mannie Fairy DASEN, DO 01/03/24 0902

## 2024-01-03 NOTE — H&P (Signed)
 History and Physical  Evan Myers FMW:[id number] DOB: Feb 25, 1940 DOA: 01/03/2024  PCP: Clarice Nottingham, MD   Chief Complaint: Cough, malaise, dizziness  HPI: Evan Myers is a 83 y.o. male with medical history significant for PMR, GERD, RLS being admitted to the hospital with fever and hypoxia due to influenza A.  His wife was sick last week, and recovered well, he started having cough congestion body aches and generalized weakness approximately 12/4.  Had some fever this morning, felt dizzy and lightheaded, checked his blood pressure at home and it was low at 84/58 at home.  Workup in the ER shows influenza.  Saturating 89% on room air at rest.  Review of Systems: Please see HPI for pertinent positives and negatives. A complete 10 system review of systems are otherwise negative.  Past Medical History:  Diagnosis Date   Actinic keratosis    Adenomatous polyp of colon    Allergic rhinitis    Aortic stenosis    GERD (gastroesophageal reflux disease)    Heart murmur    Hemorrhoids    HOH (hard of hearing)    Hyperhomocystinemia    Hyperlipidemia    Mild CAD    via cath in 2008 (per office notes)   Polymyalgia rheumatica    Restless leg syndrome    Tinnitus    Urticaria    Varicose veins of left lower extremity    Past Surgical History:  Procedure Laterality Date   CARDIAC CATHETERIZATION     NM MYOCAR PERF WALL MOTION  2011   bruce myoview - normal perfusion, EF 58%   TONSILLECTOMY  1949   TRANSTHORACIC ECHOCARDIOGRAM  2011   EF=>55%; mild mitral annular calcification, mild MR; mild TR, normal RVSP; mild calcification of AV leaflets and mild valvular AS; aortic root sclerosis/calcification   VASECTOMY  1975   Social History:  reports that he quit smoking about 55 years ago. His smoking use included cigarettes. He started smoking about 61 years ago. He has a 6 pack-year smoking history. He has never used smokeless tobacco. He reports that he does not drink alcohol and  does not use drugs.  Allergies  Allergen Reactions   Brinzolamide-Brimonidine Other (See Comments)    Pt states eye drops highly Irritates his eyes.   Doxycycline Other (See Comments)    constipation   Sulfamethoxazole-Trimethoprim Other (See Comments)    Pt states bactrim elevates his creatinine    Family History  Problem Relation Age of Onset   Hypertension Mother    Hyperlipidemia Mother    Heart failure Mother    Skin cancer Mother    Heart attack Father 81   Hypertension Father    COPD Father    Emphysema Father    Stroke Father    Hypotension Maternal Grandmother    Diabetes Paternal Grandmother    Nephrolithiasis Brother        half - sibling   Hypertension Brother        half - sibling   Hyperlipidemia Brother        half - sibling   Hypertension Sister    SIDS Brother        9 months, half - sibling     Prior to Admission medications   Medication Sig Start Date End Date Taking? Authorizing Provider  alfuzosin (UROXATRAL) 10 MG 24 hr tablet Take 10 mg by mouth daily.     [provider]  aspirin  81 MG tablet Take 81 mg by mouth daily.  [provider]  Carboxymethylcellulose Sodium (THERATEARS) 0.25 % SOLN Apply to eye as needed.    [provider]  cetirizine (ZYRTEC) 10 MG tablet Take 10 mg by mouth daily.    [provider]  ciprofloxacin (CIPRO) 500 MG tablet Take 500 mg by mouth as needed. Patient not taking: Reported on 05/28/2023 12/15/18   [provider]  ezetimibe  (ZETIA ) 10 MG tablet Take 10 mg by mouth daily.    [provider]  latanoprost  (XALATAN ) 0.005 % ophthalmic solution Place 1 drop into the right eye at bedtime. 04/24/21   [provider]  levothyroxine  (SYNTHROID ) 50 MCG tablet Take 50 mcg by mouth daily before breakfast.    [provider]  LIVALO 4 MG TABS Take 0.5 mg by mouth daily.    [provider]  pantoprazole  (PROTONIX ) 40 MG tablet Take 40 mg by mouth  daily.    [provider]  XDEMVY 0.25 % SOLN Place 10 mLs into both eyes 2 (two) times daily. FOR 6 WEEKS 04/28/23   [provider]    Physical Exam: BP 105/68 (BP Location: Right Arm)   Pulse (!) 110   Temp (!) 101.3 F (38.5 C) (Oral)   Resp (!) 21   SpO2 (!) 89%  General:  Alert, oriented, calm, in no acute distress, wife and son at the bedside.  Patient is speaking in full sentences, has intermittent wet sounding cough.  He is saturating 96% on 2 L nasal cannula oxygen. Eyes: EOMI, clear conjuctivae, white sclerea Neck: supple, no masses, trachea mildline  Cardiovascular: RRR, no murmurs or rubs, no peripheral edema  Respiratory: clear to auscultation bilaterally, some mild rhonchi, no active wheezing, tachypnea or retractions Abdomen: soft, nontender, nondistended, normal bowel tones heard  Skin: dry, no rashes  Musculoskeletal: no joint effusions, normal range of motion  Psychiatric: appropriate affect, normal speech  Neurologic: extraocular muscles intact, clear speech, moving all extremities with intact sensorium         Labs on Admission:  Basic Metabolic Panel: Recent Labs  Lab 01/03/24 0739  NA 137  K 3.7  CL 102  CO2 23  GLUCOSE 131*  BUN 17  CREATININE 1.18  CALCIUM 9.3   Liver Function Tests: Recent Labs  Lab 01/03/24 0739  AST 27  ALT 18  ALKPHOS 50  BILITOT 0.5  PROT 7.5  ALBUMIN 4.2   No results for input(s): LIPASE, AMYLASE in the last 168 hours. No results for input(s): AMMONIA in the last 168 hours. CBC: Recent Labs  Lab 01/03/24 0739  WBC 9.1  NEUTROABS 7.5  HGB 15.1  HCT 43.4  MCV 91.9  PLT 154   Cardiac Enzymes: No results for input(s): CKTOTAL, CKMB, CKMBINDEX, TROPONINI in the last 168 hours. BNP (last 3 results) No results for input(s): BNP in the last 8760 hours.  ProBNP (last 3 results) No results for input(s): PROBNP in the last 8760 hours.  CBG: No results for input(s): GLUCAP in  the last 168 hours.  Radiological Exams on Admission: DG Chest Port 1 View Result Date: 01/03/2024 CLINICAL DATA:  Cough EXAM: PORTABLE CHEST - 1 VIEW COMPARISON:  None available. FINDINGS: Cardiomediastinal silhouette and pulmonary vasculature are within normal limits. Lungs are clear. IMPRESSION: No acute cardiopulmonary process. Electronically Signed   By: Aliene Lloyd M.D.   On: 01/03/2024 08:54   Assessment/Plan Evan Myers is a 83 y.o. male with medical history significant for PMR, GERD, RLS being admitted to the hospital  with fever and hypoxia due to influenza A.  Sepsis due to Influenza A-with hypoxia, fever and tachycardia.  No evidence of endorgan dysfunction, patient is hemodynamically stable, with normal lactate. -Observation admission -IV fluids -Supplemental oxygen as needed, wean as tolerated -Tylenol  for fever, Tessalon  for cough  Hypothyroidism-Synthroid   Hyperlipidemia-Zetia   GERD-Protonix   DVT prophylaxis: Lovenox      Code Status: Full Code  Consults called: None  Admission status: Observation  Time spent: 42 minutes  Yenty Bloch CHRISTELLA Gail MD [facility] Pager [phone]  If 7PM-7AM, please contact night-coverage [email or web address] Password [email or web address]  01/03/2024, 10:10 AM

## 2024-01-03 NOTE — ED Notes (Signed)
 Blood cultures were sent by RN in triage before antibiotics given

## 2024-01-03 NOTE — ED Triage Notes (Signed)
 Pt arrives with complaints of sore throat and URI sx that began on Thursday and worsened. PT states that as his sx progressed he has struggled with mobility and stability. Pts BP was 84/58 at home per wife.

## 2024-01-03 NOTE — Sepsis Progress Note (Signed)
 Code Sepsis protocol being monitored by eLink.

## 2024-01-04 ENCOUNTER — Other Ambulatory Visit (HOSPITAL_COMMUNITY): Payer: Self-pay

## 2024-01-04 DIAGNOSIS — E039 Hypothyroidism, unspecified: Secondary | ICD-10-CM | POA: Insufficient documentation

## 2024-01-04 DIAGNOSIS — A419 Sepsis, unspecified organism: Secondary | ICD-10-CM | POA: Insufficient documentation

## 2024-01-04 LAB — BASIC METABOLIC PANEL WITH GFR
Anion gap: 9 (ref 5–15)
BUN: 17 mg/dL (ref 8–23)
CO2: 24 mmol/L (ref 22–32)
Calcium: 8.4 mg/dL — ABNORMAL LOW (ref 8.9–10.3)
Chloride: 107 mmol/L (ref 98–111)
Creatinine, Ser: 0.98 mg/dL (ref 0.61–1.24)
GFR, Estimated: >60 mL/min (ref >60)
Glucose, Bld: 105 mg/dL — ABNORMAL HIGH (ref 70–99)
Potassium: 3.5 mmol/L (ref 3.5–5.1)
Sodium: 141 mmol/L (ref 135–145)

## 2024-01-04 LAB — CBC
HCT: 34.8 % — ABNORMAL LOW (ref 39.0–52.0)
Hemoglobin: 11.9 g/dL — ABNORMAL LOW (ref 13.0–17.0)
MCH: 31.5 pg (ref 26.0–34.0)
MCHC: 34.2 g/dL (ref 30.0–36.0)
MCV: 92.1 fL (ref 80.0–100.0)
Platelets: 119 K/uL — ABNORMAL LOW (ref 150–400)
RBC: 3.78 MIL/uL — ABNORMAL LOW (ref 4.22–5.81)
RDW: 14.1 % (ref 11.5–15.5)
WBC: 9.4 K/uL (ref 4.0–10.5)
nRBC: 0 % (ref 0.0–0.2)

## 2024-01-04 MED ORDER — OSELTAMIVIR PHOSPHATE 75 MG PO CAPS
75.0000 mg | ORAL_CAPSULE | Freq: Two times a day (BID) | ORAL | 0 refills | Status: AC
  Filled 2024-01-04: qty 8, 4d supply, fill #0

## 2024-01-04 MED ORDER — TAMSULOSIN HCL 0.4 MG PO CAPS
0.4000 mg | ORAL_CAPSULE | Freq: Every day | ORAL | Status: DC
  Filled 2024-01-04: qty 1

## 2024-01-04 MED ORDER — OSELTAMIVIR PHOSPHATE 75 MG PO CAPS
75.0000 mg | ORAL_CAPSULE | Freq: Two times a day (BID) | ORAL | Status: DC
  Administered 2024-01-04: 75 mg via ORAL
  Filled 2024-01-04: qty 1

## 2024-01-04 NOTE — Progress Notes (Signed)
 Discharge medication delivered to patient at the bedside

## 2024-01-04 NOTE — Care Management CC44 (Signed)
 Condition Code 44 Documentation Completed  Patient Details  Name: RIYANSH GERSTNER MRN: [id number] Date of Birth: 1940/09/08   Condition Code 44 given:  Yes Patient signature on Condition Code 44 notice:  Yes Documentation of 2 MD's agreement:  Yes Code 44 added to claim:  Yes    Doneta Glenys DASEN, RN 01/04/2024, 2:55 PM

## 2024-01-04 NOTE — Care Management Obs Status (Signed)
 MEDICARE OBSERVATION STATUS NOTIFICATION   Patient Details  Name: Evan Myers MRN: [id number] Date of Birth: [birth year]   Medicare Observation Status Notification Given:  Yes    Doneta Glenys DASEN, RN 01/04/2024, 2:55 PM

## 2024-01-04 NOTE — Evaluation (Signed)
 Physical Therapy Evaluation Patient Details Name: Evan Myers MRN: [id number] DOB: Aug 25, 1940 Today's Date: 01/04/2024  History of Present Illness  Pt is 83 yo male admitted 01/03/24 with sepsis due to influenza.  He had orthostatic hypotension but has resolved.  Pt w hx including but not limited to polymyalgia rheumatica and RLS  Clinical Impression  Pt admitted with above diagnosis.  He demonstrates safe transfers and ambulation at independent level  and stairs with supervision.  Pt near baseline.  He was on RA with sats >94%.  Educated on incentive spirometer, frequent mobility, and gradually returning to normal activity.  No further PT needs.       If plan is discharge home, recommend the following:     Can travel by private vehicle        Equipment Recommendations None recommended by PT  Recommendations for Other Services       Functional Status Assessment Patient has not had a recent decline in their functional status     Precautions / Restrictions Precautions Precautions: None      Mobility  Bed Mobility Overal bed mobility: Independent                  Transfers Overall transfer level: Independent                      Ambulation/Gait Ambulation/Gait assistance: Independent Gait Distance (Feet): 400 Feet Assistive device: None Gait Pattern/deviations: WFL(Within Functional Limits)       General Gait Details: Therapist providing supervision for eval but pt at indepednent level  Stairs Stairs: Yes Stairs assistance: Supervision Stair Management: No rails, Alternating pattern, Forwards Number of Stairs: 10 General stair comments: without difficulty  Wheelchair Mobility     Tilt Bed    Modified Rankin (Stroke Patients Only)       Balance Overall balance assessment: Independent                                           Pertinent Vitals/Pain Pain Assessment Pain Assessment: No/denies pain    Home Living  Family/patient expects to be discharged to:: Private residence Living Arrangements: Spouse/significant other Available Help at Discharge: Family;Available 24 hours/day Type of Home: House Home Access: Stairs to enter Entrance Stairs-Rails: Doctor, General Practice of Steps: 4   Home Layout: Two level;Bed/bath upstairs Home Equipment: Agricultural Consultant (2 wheels);Cane - single point;BSC/3in1 Additional Comments: equipment from mother    Prior Function Prior Level of Function : Independent/Modified Independent;Driving             Mobility Comments: could ambulate in community ADLs Comments: completely independent adls and iadls; likes to sing and play trumpet     Extremity/Trunk Assessment   Upper Extremity Assessment Upper Extremity Assessment: Overall WFL for tasks assessed    Lower Extremity Assessment Lower Extremity Assessment: Overall WFL for tasks assessed    Cervical / Trunk Assessment Cervical / Trunk Assessment: Normal  Communication        Cognition Arousal: Alert Behavior During Therapy: WFL for tasks assessed/performed   PT - Cognitive impairments: No apparent impairments                                 Cueing       General Comments General comments (skin integrity, edema, etc.): Pt  on RA with sats 94% rest and activity.  Pt asked about incentive spirometer.  Provided and educated.  Pt performed x 5 up to 2000-2250    Exercises     Assessment/Plan    PT Assessment Patient does not need any further PT services  PT Problem List         PT Treatment Interventions      PT Goals (Current goals can be found in the Care Plan section)  Acute Rehab PT Goals Patient Stated Goal: return home PT Goal Formulation: All assessment and education complete, DC therapy    Frequency       Co-evaluation               AM-PAC PT 6 Clicks Mobility  Outcome Measure Help needed turning from your back to your side while in a flat  bed without using bedrails?: None Help needed moving from lying on your back to sitting on the side of a flat bed without using bedrails?: None Help needed moving to and from a bed to a chair (including a wheelchair)?: None Help needed standing up from a chair using your arms (e.g., wheelchair or bedside chair)?: None Help needed to walk in hospital room?: None Help needed climbing 3-5 steps with a railing? : A Little 6 Click Score: 23    End of Session   Activity Tolerance: Patient tolerated treatment well Patient left: in chair;with call bell/phone within reach Nurse Communication: Mobility status PT Visit Diagnosis: Other abnormalities of gait and mobility (R26.89)    Time: 8781-8763 PT Time Calculation (min) (ACUTE ONLY): 18 min   Charges:   PT Evaluation $PT Eval Low Complexity: 1 Low   PT General Charges $$ ACUTE PT VISIT: 1 Visit         Benjiman, PT Acute Rehab Services [facility] [phone]   Benjiman VEAR Mulberry 01/04/2024, 1:00 PM

## 2024-01-04 NOTE — Care Management Obs Status (Signed)
 MEDICARE OBSERVATION STATUS NOTIFICATION   Patient Details  Name: Evan Myers MRN: [id number] Date of Birth: 1940-09-27   Medicare Observation Status Notification Given:  Yes    Doneta Glenys DASEN, RN 01/04/2024, 2:58 PM

## 2024-01-04 NOTE — TOC Transition Note (Signed)
 Transition of Care [facility]) - Discharge Note   Patient Details  Name: Evan Myers MRN: [id number] Date of Birth: 27-Sep-1940  [facility]) CM/SW Contact:  Doneta Glenys DASEN, RN Phone Number: 01/04/2024, 2:58 PM   Clinical Narrative:    CODE 44 Completed.     Barriers to Discharge: No Barriers Identified   Patient Goals and CMS Choice Patient states their goals for this hospitalization and ongoing recovery are:: Home with wife CMS Medicare.gov Compare Post Acute Care list provided to:: Patient (NA) Choice offered to / list presented to : Patient, NA [place] ownership interest in [facility].provided to:: Parent NA    Discharge Placement                       Discharge Plan and Services Additional resources added to the After Visit Summary for   In-house Referral: NA Discharge Planning Services: CM Consult            DME Arranged: N/A DME Agency: NA       HH Arranged: NA HH Agency: NA        Social Drivers of Health (SDOH) Interventions SDOH Screenings   Food Insecurity: No Food Insecurity (01/03/2024)  Housing: Low Risk  (01/03/2024)  Transportation Needs: No Transportation Needs (01/03/2024)  Utilities: Not At Risk (01/03/2024)  Social Connections: Socially Integrated (01/03/2024)  Tobacco Use: Medium Risk (05/28/2023)     Readmission Risk Interventions     No data to display

## 2024-01-04 NOTE — Progress Notes (Signed)
 OT Cancellation Note  Patient Details Name: Evan Myers MRN: [id number] DOB: 1940-08-06   Cancelled Treatment:    Reason Eval/Treat Not Completed: OT screened, no needs identified. Patient is functionally independent. OT will sign off.   Delanna JINNY Lesches, OTR/L 01/04/2024, 1:18 PM

## 2024-01-04 NOTE — TOC Initial Note (Signed)
 Transition of Care [facility]) - Initial/Assessment Note    Patient Details  Name: Evan Myers MRN: [id number] Date of Birth: 01-04-1941  Transition of Care [facility]) CM/SW Contact:    Doneta Glenys DASEN, RN Phone Number: 01/04/2024, 9:11 AM  Clinical Narrative:                 MOON completed. Home. No needs identified.  Expected Discharge Plan: Home/Self Care Barriers to Discharge: No Barriers Identified   Patient Goals and CMS Choice Patient states their goals for this hospitalization and ongoing recovery are:: Home with wife CMS Medicare.gov Compare Post Acute Care list provided to:: Patient (NA) Choice offered to / list presented to : Patient, NA [place] ownership interest in [facility].provided to:: Parent NA    Expected Discharge Plan and Services In-house Referral: NA Discharge Planning Services: CM Consult   Living arrangements for the past 2 months: Single Family Home                 DME Arranged: N/A DME Agency: NA       HH Arranged: NA HH Agency: NA        Prior Living Arrangements/Services Living arrangements for the past 2 months: Single Family Home Lives with:: Spouse Patient language and need for interpreter reviewed:: Yes Do you feel safe going back to the place where you live?: Yes      Need for Family Participation in Patient Care: No (Comment) Care giver support system in place?: Yes (comment) Current home services: DME (cane,walker,BSC) Criminal Activity/Legal Involvement Pertinent to Current Situation/Hospitalization: No - Comment as needed  Activities of Daily Living   ADL Screening (condition at time of admission) Independently performs ADLs?: Yes (appropriate for developmental age) Is the patient deaf or have difficulty hearing?: Yes Does the patient have difficulty seeing, even when wearing glasses/contacts?: Yes Does the patient have difficulty concentrating, remembering, or making decisions?: No  Permission  Sought/Granted Permission sought to share information with : Case Manager Permission granted to share information with : Yes, Verbal Permission Granted  Share Information with NAME: Ross, Bender (Spouse)  [phone]           Emotional Assessment   Attitude/Demeanor/Rapport: Engaged Affect (typically observed): Appropriate Orientation: : Oriented to Self, Oriented to Place, Oriented to  Time, Oriented to Situation Alcohol / Substance Use: Not Applicable Psych Involvement: No (comment)  Admission diagnosis:  Influenza A [J10.1] Hypoxia [R09.02] Influenza [J11.1] Patient Active Problem List   Diagnosis Date Noted   Sepsis (HCC) 01/04/2024   Hypothyroidism 01/04/2024   Influenza A 01/03/2024   Bunion of great toe of left foot 11/05/2022   Bunion of great toe of right foot 11/05/2022   Cardiac murmur 09/16/2013   Aortic valve stenosis 09/16/2013   Hyperlipidemia 09/16/2013   PCP:  Clarice Nottingham, MD Pharmacy:   [facility] - [place], [place] - [address] [place] [address] Phone: [phone] Fax: [phone]  [facility] [address], [place] - [address] [place] [address] Phone: [phone] Fax: [phone]     Social Drivers of Health (SDOH) Social History: SDOH Screenings   Food Insecurity: No Food Insecurity (01/03/2024)  Housing: Low Risk  (01/03/2024)  Transportation Needs: No Transportation Needs (01/03/2024)  Utilities: Not At Risk (01/03/2024)  Social Connections: Socially Integrated (01/03/2024)  Tobacco Use: Medium Risk (05/28/2023)   SDOH Interventions:     Readmission Risk Interventions  No data to display

## 2024-01-04 NOTE — Discharge Summary (Signed)
 Physician Discharge Summary  Evan Myers FMW:[id number] DOB: 09/03/1940 DOA: 01/03/2024  PCP: Clarice Nottingham, MD  Admit date: 01/03/2024 Discharge date: 01/04/2024  Admitted From: Home Disposition:  Home  Recommendations for Outpatient Follow-up:  Follow up with PCP in 1-2 weeks Please obtain BMP/CBC in one week   Home Health:No Equipment/Devices:No  Discharge Condition:Stable CODE STATUS:Full Diet recommendation: Heart Healthy  Brief/Interim Summary: 83 y.o. male past medical history polymyalgia rheumatica, RLS comes in with fever and hypoxia found to have influenza A   Discharge Diagnoses:  Principal Problem:   Influenza A Active Problems:   Hyperlipidemia   Sepsis (HCC)   Hypothyroidism  Sepsis due to influenza A/acute respiratory failure with hypoxia: No endorgan damage. He was aggressively resuscitated. Started on on Tamiflu  and inhalers. We were able to wean him to room air. He will continue to take Tylenol  at home along with Tamiflu  he was discharged in stable condition. He was able to ambulate without oxygen and saturation remained greater than 92%.  Orthostatic hypotension: This resolved with IV fluids.  Hypothyroidism:  Cont. Synthroid .  Hyperlipidemia: Continue statins.  Discharge Instructions  Discharge Instructions     Diet - low sodium heart healthy   Complete by: As directed    Increase activity slowly   Complete by: As directed       Allergies as of 01/04/2024       Reactions   Brinzolamide-brimonidine Other (See Comments)   Pt states eye drops highly Irritates his eyes.   Doxycycline Other (See Comments)   constipation   Sulfamethoxazole-trimethoprim Other (See Comments)   Pt states bactrim elevates his creatinine        Medication List     STOP taking these medications    Xdemvy 0.25 % Soln Generic drug: Lotilaner       TAKE these medications    alfuzosin 10 MG 24 hr tablet Commonly known as: UROXATRAL Take 10  mg by mouth daily.   aspirin  81 MG tablet Take 81 mg by mouth daily.   cetirizine 10 MG tablet Commonly known as: ZYRTEC Take 10 mg by mouth daily.   ciprofloxacin 500 MG tablet Commonly known as: CIPRO Take 500 mg by mouth as needed (prostatitis).   dorzolamide  2 % ophthalmic solution Commonly known as: TRUSOPT  Place 1 drop into the right eye 2 (two) times daily.   ezetimibe  10 MG tablet Commonly known as: ZETIA  Take 10 mg by mouth daily.   latanoprost  0.005 % ophthalmic solution Commonly known as: XALATAN  Place 1 drop into the right eye at bedtime.   levothyroxine  50 MCG tablet Commonly known as: SYNTHROID  Take 50 mcg by mouth daily before breakfast.   oseltamivir  75 MG capsule Commonly known as: TAMIFLU  Take 1 capsule (75 mg total) by mouth 2 (two) times daily for 4 days.   pantoprazole  40 MG tablet Commonly known as: PROTONIX  Take 40 mg by mouth daily.   Pitavastatin Calcium 2 MG Tabs Take 2 mg by mouth daily.   Theratears 0.25 % Soln Generic drug: Carboxymethylcellulose Sodium Place 1 drop into both eyes as needed (dry eyes).        Allergies  Allergen Reactions   Brinzolamide-Brimonidine Other (See Comments)    Pt states eye drops highly Irritates his eyes.   Doxycycline Other (See Comments)    constipation   Sulfamethoxazole-Trimethoprim Other (See Comments)    Pt states bactrim elevates his creatinine    Consultations: None   Procedures/Studies: DG Chest Port 1 View Result Date: 01/03/2024  CLINICAL DATA:  Cough EXAM: PORTABLE CHEST - 1 VIEW COMPARISON:  None available. FINDINGS: Cardiomediastinal silhouette and pulmonary vasculature are within normal limits. Lungs are clear. IMPRESSION: No acute cardiopulmonary process. Electronically Signed   By: Aliene Lloyd M.D.   On: 01/03/2024 08:54   ECHOCARDIOGRAM COMPLETE Result Date: 12/29/2023    ECHOCARDIOGRAM REPORT   Patient Name:   Evan Myers Date of Exam: 12/29/2023 Medical Rec #:   [id number]        Height:       69.0 in Accession #:    [id number]       Weight:       192.0 lb Date of Birth:  03-15-1940        BSA:          2.030 m Patient Age:    83 years         BP:           122/78 mmHg Patient Gender: M                HR:           52 bpm. Exam Location:  [address] Procedure: 2D Echo, Cardiac Doppler and Color Doppler (Both Spectral and Color            Flow Doppler were utilized during procedure). Indications:    I35.0 Aortic Stenosis  History:        Patient has prior history of Echocardiogram examinations, most                 recent 07/01/2023. CAD, Aortic Valve Disease, Arrythmias:LBBB,                 Signs/Symptoms:Murmur; Risk Factors:Dyslipidemia, Family History                 of Coronary Artery Disease and Former Smoker. Severe Aortic                 Stenosis.  Sonographer:    Heather Hawks RDCS Referring Phys: FRANCYNE HEADLAND IMPRESSIONS  1. Moderate to severe aortic stenosis. Vmax 3.7 m/s, MG 31 mmHG, AVA 0.93 cm2, DI 0.22. Consider aortic valve calcium score or CCTA for better characterization if there are clinical concerns for severe AS. MG similar to prior study. The aortic valve is tricuspid. There is moderate calcification of the aortic valve. There is moderate thickening of the aortic valve. Aortic valve regurgitation is mild. Moderate to severe aortic valve stenosis. Aortic valve area, by VTI measures 0.93 cm. Aortic valve mean  gradient measures 31.0 mmHg. Aortic valve Vmax measures 3.77 m/s.  2. Left ventricular ejection fraction, by estimation, is 60 to 65%. The left ventricle has normal function. The left ventricle has no regional wall motion abnormalities. Left ventricular diastolic parameters were normal.  3. Right ventricular systolic function is normal. The right ventricular size is normal.  4. The mitral valve is grossly normal. Mild mitral valve regurgitation. No evidence of mitral stenosis. FINDINGS  Left Ventricle: Left ventricular ejection fraction, by  estimation, is 60 to 65%. The left ventricle has normal function. The left ventricle has no regional wall motion abnormalities. The left ventricular internal cavity size was normal in size. There is  no left ventricular hypertrophy. Left ventricular diastolic parameters were normal. Right Ventricle: The right ventricular size is normal. No increase in right ventricular wall thickness. Right ventricular systolic function is normal. Left Atrium: Left atrial size was normal in size. Right Atrium: Right atrial  size was normal in size. Pericardium: There is no evidence of pericardial effusion. Mitral Valve: The mitral valve is grossly normal. Mild mitral valve regurgitation. No evidence of mitral valve stenosis. Tricuspid Valve: The tricuspid valve is grossly normal. Tricuspid valve regurgitation is trivial. No evidence of tricuspid stenosis. Aortic Valve: Moderate to severe aortic stenosis. Vmax 3.7 m/s, MG 31 mmHG, AVA 0.93 cm2, DI 0.22. Consider aortic valve calcium score or CCTA for better characterization if there are clinical concerns for severe AS. MG similar to prior study. The aortic  valve is tricuspid. There is moderate calcification of the aortic valve. There is moderate thickening of the aortic valve. Aortic valve regurgitation is mild. Aortic regurgitation PHT measures 641 msec. Moderate to severe aortic stenosis is present. Aortic valve mean gradient measures 31.0 mmHg. Aortic valve peak gradient measures 56.9 mmHg. Aortic valve area, by VTI measures 0.93 cm. Pulmonic Valve: The pulmonic valve was grossly normal. Pulmonic valve regurgitation is trivial. No evidence of pulmonic stenosis. Aorta: The aortic root and ascending aorta are structurally normal, with no evidence of dilitation. Venous: The right lower pulmonary vein is normal. The inferior vena cava was not well visualized. IAS/Shunts: The atrial septum is grossly normal.  LEFT VENTRICLE PLAX 2D LVIDd:         4.40 cm   Diastology LVIDs:          2.60 cm   LV e' medial:    7.83 cm/s LV PW:         1.20 cm   LV E/e' medial:  9.1 LV IVS:        1.10 cm   LV e' lateral:   9.08 cm/s LVOT diam:     2.30 cm   LV E/e' lateral: 7.8 LV SV:         102 LV SV Index:   50 LVOT Area:     4.15 cm LV IVRT:       85 msec  RIGHT VENTRICLE RV Basal diam:  3.30 cm     PULMONARY VEINS RV S prime:     13.20 cm/s  A Reversal Velocity: 31.20 cm/s TAPSE (M-mode): 2.7 cm      Diastolic Velocity:  30.40 cm/s RVSP:           21.8 mmHg   S/D Velocity:        1.40                             Systolic Velocity:   41.50 cm/s LEFT ATRIUM             Index        RIGHT ATRIUM           Index LA diam:        4.00 cm 1.97 cm/m   RA Pressure: 3.00 mmHg LA Vol (A2C):   83.3 ml 41.02 ml/m  RA Area:     17.10 cm LA Vol (A4C):   46.0 ml 22.65 ml/m  RA Volume:   50.00 ml  24.62 ml/m LA Biplane Vol: 67.2 ml 33.10 ml/m  AORTIC VALVE                     PULMONIC VALVE AV Area (Vmax):    0.98 cm      PV Vmax:       1.35 m/s AV Area (Vmean):   1.00 cm      PV Vmean:  92.000 cm/s AV Area (VTI):     0.93 cm      PV VTI:        0.358 m AV Vmax:           377.00 cm/s   PV Peak grad:  7.3 mmHg AV Vmean:          254.000 cm/s  PV Mean grad:  4.0 mmHg AV VTI:            1.100 m AV Peak Grad:      56.9 mmHg AV Mean Grad:      31.0 mmHg LVOT Vmax:         88.60 cm/s LVOT Vmean:        61.000 cm/s LVOT VTI:          0.246 m LVOT/AV VTI ratio: 0.22 AI PHT:            641 msec  AORTA Ao Root diam: 2.80 cm Ao Asc diam:  3.60 cm MITRAL VALVE               TRICUSPID VALVE MV Area (PHT): cm         TR Peak grad:   18.8 mmHg MV Decel Time: 264 msec    TR Vmax:        217.00 cm/s MV E velocity: 71.25 cm/s  Estimated RAP:  3.00 mmHg MV A velocity: 51.72 cm/s  RVSP:           21.8 mmHg MV E/A ratio:  1.38                            SHUNTS                            Systemic VTI:  0.25 m                            Systemic Diam: 2.30 cm Darryle Decent MD Electronically signed by Darryle Decent MD Signature  Date/Time: 12/29/2023/1:58:57 PM    Final     Subjective: No complaints feels great  Discharge Exam: Vitals:   01/04/24 0851 01/04/24 1258  BP:  (!) 118/51  Pulse:  81  Resp:  16  Temp:  99.3 F (37.4 C)  SpO2: 93% 95%   Vitals:   01/04/24 0345 01/04/24 0811 01/04/24 0851 01/04/24 1258  BP: (!) 115/56 110/72  (!) 118/51  Pulse: 85 89  81  Resp: 18 16  16   Temp: 99.1 F (37.3 C) 98.9 F (37.2 C)  99.3 F (37.4 C)  TempSrc: Oral Oral  Oral  SpO2: 95% 90% 93% 95%  Weight:   88.8 kg   Height:   5' 9 (1.753 m)     General: Pt is alert, awake, not in acute distress Cardiovascular: RRR, S1/S2 +, no rubs, no gallops Respiratory: CTA bilaterally, no wheezing, no rhonchi Abdominal: Soft, NT, ND, bowel sounds + Extremities: no edema, no cyanosis    The results of significant diagnostics from this hospitalization (including imaging, microbiology, ancillary and laboratory) are listed below for reference.     Microbiology: Recent Results (from the past 240 hours)  Resp panel by RT-PCR (RSV, Flu A&B, Covid) Anterior Nasal Swab     Status: Abnormal   Collection Time: 01/03/24  7:40 AM   Specimen: Anterior Nasal Swab  Result Value Ref  Range Status   SARS Coronavirus 2 by RT PCR NEGATIVE NEGATIVE Final    Comment: (NOTE) SARS-CoV-2 target nucleic acids are NOT DETECTED.  The SARS-CoV-2 RNA is generally detectable in upper respiratory specimens during the acute phase of infection. The lowest concentration of SARS-CoV-2 viral copies this assay can detect is 138 copies/mL. A negative result does not preclude SARS-Cov-2 infection and should not be used as the sole basis for treatment or other patient management decisions. A negative result may occur with  improper specimen collection/handling, submission of specimen other than nasopharyngeal swab, presence of viral mutation(s) within the areas targeted by this assay, and inadequate number of viral copies(<138 copies/mL). A  negative result must be combined with clinical observations, patient history, and epidemiological information. The expected result is Negative.  Fact Sheet for Patients:  bloggercourse.com  Fact Sheet for Healthcare Providers:  seriousbroker.it  This test is no t yet approved or cleared by the [place]  FDA and  has been authorized for detection and/or diagnosis of SARS-CoV-2 by FDA under an Emergency Use Authorization (EUA). This EUA will remain  in effect (meaning this test can be used) for the duration of the COVID-19 declaration under Section 564(b)(1) of the Act, 21 U.S.C.section 360bbb-3(b)(1), unless the authorization is terminated  or revoked sooner.       Influenza A by PCR POSITIVE (A) NEGATIVE Final   Influenza B by PCR NEGATIVE NEGATIVE Final    Comment: (NOTE) The Xpert Xpress SARS-CoV-2/FLU/RSV plus assay is intended as an aid in the diagnosis of influenza from Nasopharyngeal swab specimens and should not be used as a sole basis for treatment. Nasal washings and aspirates are unacceptable for Xpert Xpress SARS-CoV-2/FLU/RSV testing.  Fact Sheet for Patients: bloggercourse.com  Fact Sheet for Healthcare Providers: seriousbroker.it  This test is not yet approved or cleared by the [place]  FDA and has been authorized for detection and/or diagnosis of SARS-CoV-2 by FDA under an Emergency Use Authorization (EUA). This EUA will remain in effect (meaning this test can be used) for the duration of the COVID-19 declaration under Section 564(b)(1) of the Act, 21 U.S.C. section 360bbb-3(b)(1), unless the authorization is terminated or revoked.     Resp Syncytial Virus by PCR NEGATIVE NEGATIVE Final    Comment: (NOTE) Fact Sheet for Patients: bloggercourse.com  Fact Sheet for Healthcare  Providers: seriousbroker.it  This test is not yet approved or cleared by the [place]  FDA and has been authorized for detection and/or diagnosis of SARS-CoV-2 by FDA under an Emergency Use Authorization (EUA). This EUA will remain in effect (meaning this test can be used) for the duration of the COVID-19 declaration under Section 564(b)(1) of the Act, 21 U.S.C. section 360bbb-3(b)(1), unless the authorization is terminated or revoked.  Performed at [facility], [address], [place], [place] [address]   Blood Culture (routine x 2)     Status: None (Preliminary result)   Collection Time: 01/03/24  7:44 AM   Specimen: BLOOD  Result Value Ref Range Status   Specimen Description   Final    BLOOD SITE NOT SPECIFIED Performed at [facility], [address], [place], [place] [address]    Special Requests   Final    BOTTLES DRAWN AEROBIC AND ANAEROBIC Blood Culture adequate volume Performed at [facility], [address], [place], [place] [address]    Culture   Final    NO GROWTH < 24 HOURS Performed at [facility], [address] [place], [place] [address]    Report Status PENDING  Incomplete  Blood Culture (routine x 2)     Status: None (Preliminary result)   Collection Time: 01/03/24  7:56 AM   Specimen: BLOOD  Result Value Ref Range Status   Specimen Description   Final    BLOOD LEFT ANTECUBITAL Performed at [facility], [address], [place], [place] [address]    Special Requests   Final    BOTTLES DRAWN AEROBIC AND ANAEROBIC Blood Culture adequate volume Performed at [facility], [address] [place], [place] [address]    Culture   Final    NO GROWTH < 24 HOURS Performed at [facility], [address] [place], [place] [address]    Report Status PENDING  Incomplete     Labs: BNP (last 3 results) No results for  input(s): BNP in the last 8760 hours. Basic Metabolic Panel: Recent Labs  Lab 01/03/24 0739 01/04/24 0437  NA 137 141  K 3.7 3.5  CL 102 107  CO2 23 24  GLUCOSE 131* 105*  BUN 17 17  CREATININE 1.18 0.98  CALCIUM 9.3 8.4*   Liver Function Tests: Recent Labs  Lab 01/03/24 0739  AST 27  ALT 18  ALKPHOS 50  BILITOT 0.5  PROT 7.5  ALBUMIN 4.2   No results for input(s): LIPASE, AMYLASE in the last 168 hours. No results for input(s): AMMONIA in the last 168 hours. CBC: Recent Labs  Lab 01/03/24 0739 01/04/24 0437  WBC 9.1 9.4  NEUTROABS 7.5  --   HGB 15.1 11.9*  HCT 43.4 34.8*  MCV 91.9 92.1  PLT 154 119*   Cardiac Enzymes: No results for input(s): CKTOTAL, CKMB, CKMBINDEX, TROPONINI in the last 168 hours. BNP: Invalid input(s): POCBNP CBG: No results for input(s): GLUCAP in the last 168 hours. D-Dimer No results for input(s): DDIMER in the last 72 hours. Hgb A1c No results for input(s): HGBA1C in the last 72 hours. Lipid Profile No results for input(s): CHOL, HDL, LDLCALC, TRIG, CHOLHDL, LDLDIRECT in the last 72 hours. Thyroid  function studies No results for input(s): TSH, T4TOTAL, T3FREE, THYROIDAB in the last 72 hours.  Invalid input(s): FREET3 Anemia work up No results for input(s): VITAMINB12, FOLATE, FERRITIN, TIBC, IRON, RETICCTPCT in the last 72 hours. Urinalysis No results found for: COLORURINE, APPEARANCEUR, LABSPEC, PHURINE, GLUCOSEU, HGBUR, BILIRUBINUR, KETONESUR, PROTEINUR, UROBILINOGEN, NITRITE, LEUKOCYTESUR Sepsis Labs Recent Labs  Lab 01/03/24 0739 01/04/24 0437  WBC 9.1 9.4   Microbiology Recent Results (from the past 240 hours)  Resp panel by RT-PCR (RSV, Flu A&B, Covid) Anterior Nasal Swab     Status: Abnormal   Collection Time: 01/03/24  7:40 AM   Specimen: Anterior Nasal Swab  Result Value Ref Range Status   SARS Coronavirus 2 by RT PCR NEGATIVE  NEGATIVE Final    Comment: (NOTE) SARS-CoV-2 target nucleic acids are NOT DETECTED.  The SARS-CoV-2 RNA is generally detectable in upper respiratory specimens during the acute phase of infection. The lowest concentration of SARS-CoV-2 viral copies this assay can detect is 138 copies/mL. A negative result does not preclude SARS-Cov-2 infection and should not be used as the sole basis for treatment or other patient management decisions. A negative result may occur with  improper specimen collection/handling, submission of specimen other than nasopharyngeal swab, presence of viral mutation(s) within the areas targeted by this assay, and inadequate number of viral copies(<138 copies/mL). A negative result must be combined  with clinical observations, patient history, and epidemiological information. The expected result is Negative.  Fact Sheet for Patients:  bloggercourse.com  Fact Sheet for Healthcare Providers:  seriousbroker.it  This test is no t yet approved or cleared by the [place]  FDA and  has been authorized for detection and/or diagnosis of SARS-CoV-2 by FDA under an Emergency Use Authorization (EUA). This EUA will remain  in effect (meaning this test can be used) for the duration of the COVID-19 declaration under Section 564(b)(1) of the Act, 21 U.S.C.section 360bbb-3(b)(1), unless the authorization is terminated  or revoked sooner.       Influenza A by PCR POSITIVE (A) NEGATIVE Final   Influenza B by PCR NEGATIVE NEGATIVE Final    Comment: (NOTE) The Xpert Xpress SARS-CoV-2/FLU/RSV plus assay is intended as an aid in the diagnosis of influenza from Nasopharyngeal swab specimens and should not be used as a sole basis for treatment. Nasal washings and aspirates are unacceptable for Xpert Xpress SARS-CoV-2/FLU/RSV testing.  Fact Sheet for Patients: bloggercourse.com  Fact Sheet for  Healthcare Providers: seriousbroker.it  This test is not yet approved or cleared by the [place]  FDA and has been authorized for detection and/or diagnosis of SARS-CoV-2 by FDA under an Emergency Use Authorization (EUA). This EUA will remain in effect (meaning this test can be used) for the duration of the COVID-19 declaration under Section 564(b)(1) of the Act, 21 U.S.C. section 360bbb-3(b)(1), unless the authorization is terminated or revoked.     Resp Syncytial Virus by PCR NEGATIVE NEGATIVE Final    Comment: (NOTE) Fact Sheet for Patients: bloggercourse.com  Fact Sheet for Healthcare Providers: seriousbroker.it  This test is not yet approved or cleared by the [place]  FDA and has been authorized for detection and/or diagnosis of SARS-CoV-2 by FDA under an Emergency Use Authorization (EUA). This EUA will remain in effect (meaning this test can be used) for the duration of the COVID-19 declaration under Section 564(b)(1) of the Act, 21 U.S.C. section 360bbb-3(b)(1), unless the authorization is terminated or revoked.  Performed at [facility], [address], [place], [place] [address]   Blood Culture (routine x 2)     Status: None (Preliminary result)   Collection Time: 01/03/24  7:44 AM   Specimen: BLOOD  Result Value Ref Range Status   Specimen Description   Final    BLOOD SITE NOT SPECIFIED Performed at [facility], [address], [place], [place] [address]    Special Requests   Final    BOTTLES DRAWN AEROBIC AND ANAEROBIC Blood Culture adequate volume Performed at [facility], [address], [place], [place] [address]    Culture   Final    NO GROWTH < 24 HOURS Performed at [facility], [address], [place], [place] [address]    Report Status PENDING  Incomplete  Blood Culture (routine x 2)     Status: None  (Preliminary result)   Collection Time: 01/03/24  7:56 AM   Specimen: BLOOD  Result Value Ref Range Status   Specimen Description   Final    BLOOD LEFT ANTECUBITAL Performed at [facility], [address] [place], [place] [address]    Special Requests   Final    BOTTLES DRAWN AEROBIC AND ANAEROBIC Blood Culture adequate volume Performed at [facility], [address] [place], [place] [address]    Culture   Final    NO GROWTH < 24 HOURS Performed  at [facility], [address], [place], [place] [address]    Report Status PENDING  Incomplete     Time coordinating discharge: Over 35 minutes  SIGNED:   Erle Odell Castor, MD  [facility] 01/04/2024, 1:03 PM Pager   If 7PM-7AM, please contact night-coverage [email or web address] Password [email or web address]

## 2024-01-04 NOTE — Progress Notes (Signed)
 [facility] PROGRESS NOTE    Progress Note  Evan Myers  FMW:[id number] DOB: 05/27/1940 DOA: 01/03/2024 PCP: Clarice Nottingham, MD     Brief Narrative:   Evan Myers is an 83 y.o. male past medical history polymyalgia rheumatica, RLS comes in with fever and hypoxia found to have influenza A.  Assessment/Plan:   Sepsis due to Influenza A/acute respiratory failure with hypoxia due to influenza: No evidence of endorgan damage.  He did remain hemodynamically stable overnight but continues to spike fevers.  Tmax of 101.4.  He has no leukocytosis which point to get bacterial pneumonia. Still hypoxic requiring 3 L watching to keep saturations greater than 90%. Out of bed to chair, incentive spirometry. Consult physical therapy for ambulation. Tylenol  for fevers. Ambulating check saturations with ambulation.  Orthostatic hypotension: He is on no antihypertensive medications at home, he relates his dizziness upon standing is resolved.  Hypothyroidism: Continue Synthroid .  Hyperlipidemia Continue statins.   DVT prophylaxis: lovenox  Family Communication:none Status is: Observation The patient will require care spanning > 2 midnights and should be moved to inpatient because:     Code Status:     Code Status Orders  (From admission, onward)           Start     Ordered   01/03/24 0939  Full code  Continuous       Question:  By:  Answer:  Consent: discussion documented in EHR   01/03/24 0938           Code Status History     This patient has a current code status but no historical code status.      Advance Directive Documentation    Flowsheet Row Most Recent Value  Type of Advance Directive Healthcare Power of Attorney, Living will  Pre-existing out of facility DNR order (yellow form or pink MOST form) --  MOST Form in Place? --      IV Access:   Peripheral IV   Procedures and diagnostic studies:   DG Chest Port 1 View Result Date:  01/03/2024 CLINICAL DATA:  Cough EXAM: PORTABLE CHEST - 1 VIEW COMPARISON:  None available. FINDINGS: Cardiomediastinal silhouette and pulmonary vasculature are within normal limits. Lungs are clear. IMPRESSION: No acute cardiopulmonary process. Electronically Signed   By: Aliene Lloyd M.D.   On: 01/03/2024 08:54     Medical Consultants:   None.   Subjective:    Evan Myers relates feels better lightheadedness upon standing is improved.  Objective:    Vitals:   01/03/24 1736 01/03/24 1930 01/03/24 2324 01/04/24 0345  BP: (!) 144/71 135/63 125/60 (!) 115/56  Pulse: 98 93 99 85  Resp:  17 18 18   Temp: (!) 101.5 F (38.6 C) (!) 100.7 F (38.2 C) (!) 101.4 F (38.6 C) 99.1 F (37.3 C)  TempSrc:  Oral Oral Oral  SpO2: 94% 94% 94% 95%   SpO2: 95 % O2 Flow Rate (L/min): 2.5 L/min   Intake/Output Summary (Last 24 hours) at 01/04/2024 0746 Last data filed at 01/03/2024 1822 Gross per 24 hour  Intake 1638.35 ml  Output --  Net 1638.35 ml   There were no vitals filed for this visit.  Exam: General exam: In no acute distress. Respiratory system: Good air movement and clear to auscultation. Cardiovascular system: S1 & S2 heard, RRR. No JVD.  Gastrointestinal system: Abdomen is nondistended, soft and nontender.  Extremities: No pedal edema. Skin: No rashes, lesions or ulcers Psychiatry: Judgement and insight appear  normal. Mood & affect appropriate.    Data Reviewed:    Labs: Basic Metabolic Panel: Recent Labs  Lab 01/03/24 0739 01/04/24 0437  NA 137 141  K 3.7 3.5  CL 102 107  CO2 23 24  GLUCOSE 131* 105*  BUN 17 17  CREATININE 1.18 0.98  CALCIUM 9.3 8.4*   GFR CrCl cannot be calculated (Unknown ideal weight.). Liver Function Tests: Recent Labs  Lab 01/03/24 0739  AST 27  ALT 18  ALKPHOS 50  BILITOT 0.5  PROT 7.5  ALBUMIN 4.2   No results for input(s): LIPASE, AMYLASE in the last 168 hours. No results for input(s): AMMONIA in the last  168 hours. Coagulation profile Recent Labs  Lab 01/03/24 0739  INR 1.1   COVID-19 Labs  No results for input(s): DDIMER, FERRITIN, LDH, CRP in the last 72 hours.  Lab Results  Component Value Date   SARSCOV2NAA NEGATIVE 01/03/2024    CBC: Recent Labs  Lab 01/03/24 0739 01/04/24 0437  WBC 9.1 9.4  NEUTROABS 7.5  --   HGB 15.1 11.9*  HCT 43.4 34.8*  MCV 91.9 92.1  PLT 154 119*   Cardiac Enzymes: No results for input(s): CKTOTAL, CKMB, CKMBINDEX, TROPONINI in the last 168 hours. BNP (last 3 results) No results for input(s): PROBNP in the last 8760 hours. CBG: No results for input(s): GLUCAP in the last 168 hours. D-Dimer: No results for input(s): DDIMER in the last 72 hours. Hgb A1c: No results for input(s): HGBA1C in the last 72 hours. Lipid Profile: No results for input(s): CHOL, HDL, LDLCALC, TRIG, CHOLHDL, LDLDIRECT in the last 72 hours. Thyroid  function studies: No results for input(s): TSH, T4TOTAL, T3FREE, THYROIDAB in the last 72 hours.  Invalid input(s): FREET3 Anemia work up: No results for input(s): VITAMINB12, FOLATE, FERRITIN, TIBC, IRON, RETICCTPCT in the last 72 hours. Sepsis Labs: Recent Labs  Lab 01/03/24 0739 01/03/24 0811 01/04/24 0437  WBC 9.1  --  9.4  LATICACIDVEN  --  1.3  --    Microbiology Recent Results (from the past 240 hours)  Resp panel by RT-PCR (RSV, Flu A&B, Covid) Anterior Nasal Swab     Status: Abnormal   Collection Time: 01/03/24  7:40 AM   Specimen: Anterior Nasal Swab  Result Value Ref Range Status   SARS Coronavirus 2 by RT PCR NEGATIVE NEGATIVE Final    Comment: (NOTE) SARS-CoV-2 target nucleic acids are NOT DETECTED.  The SARS-CoV-2 RNA is generally detectable in upper respiratory specimens during the acute phase of infection. The lowest concentration of SARS-CoV-2 viral copies this assay can detect is 138 copies/mL. A negative result does not preclude  SARS-Cov-2 infection and should not be used as the sole basis for treatment or other patient management decisions. A negative result may occur with  improper specimen collection/handling, submission of specimen other than nasopharyngeal swab, presence of viral mutation(s) within the areas targeted by this assay, and inadequate number of viral copies(<138 copies/mL). A negative result must be combined with clinical observations, patient history, and epidemiological information. The expected result is Negative.  Fact Sheet for Patients:  bloggercourse.com  Fact Sheet for Healthcare Providers:  seriousbroker.it  This test is no t yet approved or cleared by the [place]  FDA and  has been authorized for detection and/or diagnosis of SARS-CoV-2 by FDA under an Emergency Use Authorization (EUA). This EUA will remain  in effect (meaning this test can be used) for the duration of the COVID-19 declaration under Section 564(b)(1) of the  Act, 21 U.S.C.section 360bbb-3(b)(1), unless the authorization is terminated  or revoked sooner.       Influenza A by PCR POSITIVE (A) NEGATIVE Final   Influenza B by PCR NEGATIVE NEGATIVE Final    Comment: (NOTE) The Xpert Xpress SARS-CoV-2/FLU/RSV plus assay is intended as an aid in the diagnosis of influenza from Nasopharyngeal swab specimens and should not be used as a sole basis for treatment. Nasal washings and aspirates are unacceptable for Xpert Xpress SARS-CoV-2/FLU/RSV testing.  Fact Sheet for Patients: bloggercourse.com  Fact Sheet for Healthcare Providers: seriousbroker.it  This test is not yet approved or cleared by the [place]  FDA and has been authorized for detection and/or diagnosis of SARS-CoV-2 by FDA under an Emergency Use Authorization (EUA). This EUA will remain in effect (meaning this test can be used) for the duration of  the COVID-19 declaration under Section 564(b)(1) of the Act, 21 U.S.C. section 360bbb-3(b)(1), unless the authorization is terminated or revoked.     Resp Syncytial Virus by PCR NEGATIVE NEGATIVE Final    Comment: (NOTE) Fact Sheet for Patients: bloggercourse.com  Fact Sheet for Healthcare Providers: seriousbroker.it  This test is not yet approved or cleared by the [place]  FDA and has been authorized for detection and/or diagnosis of SARS-CoV-2 by FDA under an Emergency Use Authorization (EUA). This EUA will remain in effect (meaning this test can be used) for the duration of the COVID-19 declaration under Section 564(b)(1) of the Act, 21 U.S.C. section 360bbb-3(b)(1), unless the authorization is terminated or revoked.  Performed at [facility], [address] [place], [place] [address]      Medications:    aspirin   81 mg Oral Daily   dorzolamide   1 drop Right Eye BID   enoxaparin  (LOVENOX ) injection  40 mg Subcutaneous Q24H   ezetimibe   10 mg Oral Daily   latanoprost   1 drop Right Eye QHS   levothyroxine   50 mcg Oral QAC breakfast   loratadine   10 mg Oral Daily   pantoprazole   40 mg Oral Daily   pravastatin   40 mg Oral q1800   Continuous Infusions:    LOS: 0 days   Erle Odell Castor  [facility]  01/04/2024, 7:46 AM

## 2024-01-04 NOTE — Progress Notes (Signed)
 Mobility Specialist - Progress Note   01/04/24 0900  Mobility  Activity Ambulated with assistance  Level of Assistance Independent after set-up  Assistive Device None  Distance Ambulated (ft) 250 ft  Range of Motion/Exercises Active  Activity Response Tolerated well  Mobility Referral Yes  Mobility visit 1 Mobility  Mobility Specialist Start Time (ACUTE ONLY) 0946  Mobility Specialist Stop Time (ACUTE ONLY) 0954  Mobility Specialist Time Calculation (min) (ACUTE ONLY) 8 min   Received in bed and agreed to mobility. Walking O2 sat test completed.  Room air at rest: 93% Room air ambulating: 94% Returned to room with saturations at 94%  No c/o pain nor discomfort throughout session, returned to bed with all needs met.  Cyndee Ada Mobility Specialist

## 2024-01-04 NOTE — Plan of Care (Signed)
  Problem: Education: Goal: Knowledge of General Education information will improve Description: Including pain rating scale, medication(s)/side effects and non-pharmacologic comfort measures Outcome: Adequate for Discharge   Problem: Health Behavior/Discharge Planning: Goal: Ability to manage health-related needs will improve Outcome: Adequate for Discharge   Problem: Clinical Measurements: Goal: Ability to maintain clinical measurements within normal limits will improve Outcome: Adequate for Discharge Goal: Will remain free from infection Outcome: Adequate for Discharge Goal: Diagnostic test results will improve Outcome: Adequate for Discharge Goal: Respiratory complications will improve Outcome: Adequate for Discharge Goal: Cardiovascular complication will be avoided Outcome: Adequate for Discharge   Problem: Activity: Goal: Risk for activity intolerance will decrease Outcome: Adequate for Discharge   Problem: Nutrition: Goal: Adequate nutrition will be maintained Outcome: Adequate for Discharge   Problem: Coping: Goal: Level of anxiety will decrease Outcome: Adequate for Discharge   Problem: Elimination: Goal: Will not experience complications related to bowel motility Outcome: Adequate for Discharge Goal: Will not experience complications related to urinary retention Outcome: Adequate for Discharge   Problem: Pain Managment: Goal: General experience of comfort will improve and/or be controlled Outcome: Adequate for Discharge   Problem: Safety: Goal: Ability to remain free from injury will improve Outcome: Adequate for Discharge   Problem: Safety: Goal: Ability to remain free from injury will improve Outcome: Adequate for Discharge   Problem: Skin Integrity: Goal: Risk for impaired skin integrity will decrease Outcome: Adequate for Discharge

## 2024-01-05 NOTE — Progress Notes (Signed)
 [facility] Consult Note  Chief Complaint  Patient presents with   New Patient (Initial Visit)    Aortic stenosis   History of Present Illness: 83 yo male with history of HLD, CAD, polymyalgia rheumatica, hyperhomocystinemia, hypothyroidism and aortic stenosis who is here today as a new consult, referred by Dr. Francyne, for further discussion regarding his aortic stenosis and possible TAVR. Remote cardiac catheterization with mild CAD. He has been followed by Dr. Francyne for moderate aortic stenosis. Echo 12/29/23 with LVEF=60-65%. Mild MR. Moderate to severe aortic stenosis with mean gradient 31 mmHg, AVA 0.93 cm2, DI 0.22, SVI 50.   He tells me today that he feels well overall. He has no dyspnea, chest pain, LE edema or exercise intolerance. He has occasional fatigue. Rare dizziness. He is recovering from the flu this week. He lives in [place], [place] with his wife.  He is retired from optician, dispensing. He has no active dental problems.   Primary Care Physician: Clarice Nottingham, MD Primary Cardiologist: Croitoru Referring Cardiologist: Croitoru  Past Medical History:  Diagnosis Date   Actinic keratosis    Adenomatous polyp of colon    Allergic rhinitis    Aortic stenosis    GERD (gastroesophageal reflux disease)    Heart murmur    Hemorrhoids    HOH (hard of hearing)    Hyperhomocystinemia    Hyperlipidemia    Mild CAD    via cath in 2008 (per office notes)   Polymyalgia rheumatica    Restless leg syndrome    Tinnitus    Urticaria    Varicose veins of left lower extremity     Past Surgical History:  Procedure Laterality Date   CARDIAC CATHETERIZATION     NM MYOCAR PERF WALL MOTION  2011   bruce myoview - normal perfusion, EF 58%   TONSILLECTOMY  1949   TRANSTHORACIC ECHOCARDIOGRAM  2011   EF=>55%; mild mitral annular calcification, mild MR; mild TR, normal RVSP; mild calcification of AV leaflets and mild valvular AS; aortic root sclerosis/calcification    VASECTOMY  1975    Current Outpatient Medications  Medication Sig Dispense Refill   alfuzosin (UROXATRAL) 10 MG 24 hr tablet Take 10 mg by mouth daily.      aspirin  81 MG tablet Take 81 mg by mouth daily.     Carboxymethylcellulose Sodium (THERATEARS) 0.25 % SOLN Place 1 drop into both eyes as needed (dry eyes).     cetirizine (ZYRTEC) 10 MG tablet Take 10 mg by mouth daily.     ciprofloxacin (CIPRO) 500 MG tablet Take 500 mg by mouth as needed (prostatitis).     dorzolamide  (TRUSOPT ) 2 % ophthalmic solution Place 1 drop into the right eye 2 (two) times daily.     ezetimibe  (ZETIA ) 10 MG tablet Take 10 mg by mouth daily.     latanoprost  (XALATAN ) 0.005 % ophthalmic solution Place 1 drop into the right eye at bedtime.     levothyroxine  (SYNTHROID ) 50 MCG tablet Take 50 mcg by mouth daily before breakfast.     oseltamivir  (TAMIFLU ) 75 MG capsule Take 1 capsule (75 mg total) by mouth 2 (two) times daily for 4 days. 8 capsule 0   pantoprazole  (PROTONIX ) 40 MG tablet Take 40 mg by mouth daily.     Pitavastatin Calcium 2 MG TABS Take 2 mg by mouth daily.     No current facility-administered medications for this visit.    Allergies  Allergen Reactions   Brinzolamide-Brimonidine Other (See Comments)  Pt states eye drops highly Irritates his eyes.   Doxycycline Other (See Comments)    constipation   Sulfamethoxazole-Trimethoprim Other (See Comments)    Pt states bactrim elevates his creatinine    Social History   Socioeconomic History   Marital status: Married    Spouse name: Not on file   Number of children: 2   Years of education: bachelor's   Highest education level: Not on file  Occupational History   Occupation: electronics/engineering  Tobacco Use   Smoking status: Former    Current packs/day: 0.00    Average packs/day: 1 pack/day for 6.0 years (6.0 ttl pk-yrs)    Types: Cigarettes    Start date: 09/09/1962    Quit date: 09/08/1968    Years since quitting: 55.3    Smokeless tobacco: Never  Vaping Use   Vaping status: Never Used  Substance and Sexual Activity   Alcohol use: No   Drug use: No   Sexual activity: Not Currently  Other Topics Concern   Not on file  Social History Narrative   Not on file   Social Drivers of Health   Tobacco Use: Medium Risk (01/06/2024)   Patient History    Smoking Tobacco Use: Former    Smokeless Tobacco Use: Never    Passive Exposure: Not on Actuary Strain: Not on file  Food Insecurity: No Food Insecurity (01/03/2024)   Epic    Worried About Programme Researcher, Broadcasting/film/video in the Last Year: Never true    Ran Out of Food in the Last Year: Never true  Transportation Needs: No Transportation Needs (01/03/2024)   Epic    Lack of Transportation (Medical): No    Lack of Transportation (Non-Medical): No  Physical Activity: Not on file  Stress: Not on file  Social Connections: Socially Integrated (01/03/2024)   Social Connection and Isolation Panel    Frequency of Communication with Friends and Family: More than three times a week    Frequency of Social Gatherings with Friends and Family: More than three times a week    Attends Religious Services: More than 4 times per year    Active Member of [employer] or Organizations: Yes    Attends Banker Meetings: 1 to 4 times per year    Marital Status: Married  Catering Manager Violence: Not At Risk (01/03/2024)   Epic    Fear of Current or Ex-Partner: No    Emotionally Abused: No    Physically Abused: No    Sexually Abused: No  Depression (PHQ2-9): Not on file  Alcohol Screen: Not on file  Housing: Low Risk (01/03/2024)   Epic    Unable to Pay for Housing in the Last Year: No    Number of Times Moved in the Last Year: 0    Homeless in the Last Year: No  Utilities: Not At Risk (01/03/2024)   Epic    Threatened with loss of utilities: No  Health Literacy: Not on file    Family History  Problem Relation Age of Onset   Hypertension Mother     Hyperlipidemia Mother    Heart failure Mother    Skin cancer Mother    Heart attack Father 14   Hypertension Father    COPD Father    Emphysema Father    Stroke Father    Hypotension Maternal Grandmother    Diabetes Paternal Grandmother    Nephrolithiasis Brother        half - sibling  Hypertension Brother        half - sibling   Hyperlipidemia Brother        half - sibling   Hypertension Sister    SIDS Brother        9 months, half - sibling    Review of Systems:  As stated in the HPI and otherwise negative.   BP 110/66   Pulse 77   Ht 5' 9 (1.753 m)   Wt 190 lb (86.2 kg)   SpO2 98%   BMI 28.06 kg/m   Physical Examination: General: Well developed, well nourished, NAD  HEENT: OP clear, mucus membranes moist  SKIN: warm, dry. No rashes. Neuro: No focal deficits  Musculoskeletal: Muscle strength 5/5 all ext  Psychiatric: Mood and affect normal  Neck: No JVD Lungs:Clear bilaterally, no wheezes, rhonci, crackles Cardiovascular: Regular rate and rhythm. Loud, harsh, late peaking systolic murmur.  Abdomen:Soft. Bowel sounds present. Non-tender.  Extremities: No lower extremity edema.   EKG:  EKG is not ordered today. The ekg ordered today demonstrates   Echo 12/29/23:  1. Moderate to severe aortic stenosis. Vmax 3.7 m/s, MG 31 mmHG, AVA 0.93  cm2, DI 0.22. Consider aortic valve calcium score or CCTA for better  characterization if there are clinical concerns for severe AS. MG similar  to prior study. The aortic valve is  tricuspid. There is moderate calcification of the aortic valve. There is  moderate thickening of the aortic valve. Aortic valve regurgitation is  mild. Moderate to severe aortic valve stenosis. Aortic valve area, by VTI  measures 0.93 cm. Aortic valve mean   gradient measures 31.0 mmHg. Aortic valve Vmax measures 3.77 m/s.   2. Left ventricular ejection fraction, by estimation, is 60 to 65%. The  left ventricle has normal function. The left  ventricle has no regional  wall motion abnormalities. Left ventricular diastolic parameters were  normal.   3. Right ventricular systolic function is normal. The right ventricular  size is normal.   4. The mitral valve is grossly normal. Mild mitral valve regurgitation.  No evidence of mitral stenosis.   FINDINGS   Left Ventricle: Left ventricular ejection fraction, by estimation, is 60  to 65%. The left ventricle has normal function. The left ventricle has no  regional wall motion abnormalities. The left ventricular internal cavity  size was normal in size. There is   no left ventricular hypertrophy. Left ventricular diastolic parameters  were normal.   Right Ventricle: The right ventricular size is normal. No increase in  right ventricular wall thickness. Right ventricular systolic function is  normal.   Left Atrium: Left atrial size was normal in size.   Right Atrium: Right atrial size was normal in size.   Pericardium: There is no evidence of pericardial effusion.   Mitral Valve: The mitral valve is grossly normal. Mild mitral valve  regurgitation. No evidence of mitral valve stenosis.   Tricuspid Valve: The tricuspid valve is grossly normal. Tricuspid valve  regurgitation is trivial. No evidence of tricuspid stenosis.   Aortic Valve: Moderate to severe aortic stenosis. Vmax 3.7 m/s, MG 31  mmHG, AVA 0.93 cm2, DI 0.22. Consider aortic valve calcium score or CCTA  for better characterization if there are clinical concerns for severe AS.  MG similar to prior study. The aortic   valve is tricuspid. There is moderate calcification of the aortic valve.  There is moderate thickening of the aortic valve. Aortic valve  regurgitation is mild. Aortic regurgitation PHT measures 641  msec.  Moderate to severe aortic stenosis is present.  Aortic valve mean gradient measures 31.0 mmHg. Aortic valve peak gradient  measures 56.9 mmHg. Aortic valve area, by VTI measures 0.93 cm.    Pulmonic Valve: The pulmonic valve was grossly normal. Pulmonic valve  regurgitation is trivial. No evidence of pulmonic stenosis.   Aorta: The aortic root and ascending aorta are structurally normal, with  no evidence of dilitation.   Venous: The right lower pulmonary vein is normal. The inferior vena cava  was not well visualized.   IAS/Shunts: The atrial septum is grossly normal.     LEFT VENTRICLE  PLAX 2D  LVIDd:         4.40 cm   Diastology  LVIDs:         2.60 cm   LV e' medial:    7.83 cm/s  LV PW:         1.20 cm   LV E/e' medial:  9.1  LV IVS:        1.10 cm   LV e' lateral:   9.08 cm/s  LVOT diam:     2.30 cm   LV E/e' lateral: 7.8  LV SV:         102  LV SV Index:   50  LVOT Area:     4.15 cm  LV IVRT:       85 msec     RIGHT VENTRICLE  RV Basal diam:  3.30 cm     PULMONARY VEINS  RV S prime:     13.20 cm/s  A Reversal Velocity: 31.20 cm/s  TAPSE (M-mode): 2.7 cm      Diastolic Velocity:  30.40 cm/s  RVSP:           21.8 mmHg   S/D Velocity:        1.40                              Systolic Velocity:   41.50 cm/s   LEFT ATRIUM             Index        RIGHT ATRIUM           Index  LA diam:        4.00 cm 1.97 cm/m   RA Pressure: 3.00 mmHg  LA Vol (A2C):   83.3 ml 41.02 ml/m  RA Area:     17.10 cm  LA Vol (A4C):   46.0 ml 22.65 ml/m  RA Volume:   50.00 ml  24.62 ml/m  LA Biplane Vol: 67.2 ml 33.10 ml/m   AORTIC VALVE                     PULMONIC VALVE  AV Area (Vmax):    0.98 cm      PV Vmax:       1.35 m/s  AV Area (Vmean):   1.00 cm      PV Vmean:      92.000 cm/s  AV Area (VTI):     0.93 cm      PV VTI:        0.358 m  AV Vmax:           377.00 cm/s   PV Peak grad:  7.3 mmHg  AV Vmean:          254.000 cm/s  PV Mean grad:  4.0 mmHg  AV VTI:            1.100 m  AV Peak Grad:      56.9 mmHg  AV Mean Grad:      31.0 mmHg  LVOT Vmax:         88.60 cm/s  LVOT Vmean:        61.000 cm/s  LVOT VTI:          0.246 m  LVOT/AV VTI ratio: 0.22  AI  PHT:            641 msec    AORTA  Ao Root diam: 2.80 cm  Ao Asc diam:  3.60 cm   MITRAL VALVE               TRICUSPID VALVE  MV Area (PHT): cm         TR Peak grad:   18.8 mmHg  MV Decel Time: 264 msec    TR Vmax:        217.00 cm/s  MV E velocity: 71.25 cm/s  Estimated RAP:  3.00 mmHg  MV A velocity: 51.72 cm/s  RVSP:           21.8 mmHg  MV E/A ratio:  1.38                             SHUNTS                             Systemic VTI:  0.25 m                             Systemic Diam: 2.30 cm   Recent Labs: 11/04/2023: TSH 2.78 01/03/2024: ALT 18 01/04/2024: BUN 17; Creatinine, Ser 0.98; Hemoglobin 11.9; Platelets 119; Potassium 3.5; Sodium 141    Wt Readings from Last 3 Encounters:  01/06/24 190 lb (86.2 kg)  01/04/24 195 lb 12.3 oz (88.8 kg)  05/28/23 192 lb (87.1 kg)    Assessment and Plan:   1. Severe Aortic Valve Stenosis: He has moderate to severe aortic valve stenosis. NYHA class 1 symptoms. I have personally reviewed the echo images. The aortic valve is thickened and calcified with limited leaflet mobility. I think he would benefit from AVR when he becomes symptomatic or if there is any change in his LV systolic funciton. Given advanced age, he is not a good candidate for conventional AVR by surgical approach. I think he may be a good candidate for TAVR.   I have reviewed the natural history of aortic stenosis with the patient and their family members  who are present today. We have discussed the limitations of medical therapy and the poor prognosis associated with symptomatic aortic stenosis. We have reviewed potential treatment options, including palliative medical therapy, conventional surgical aortic valve replacement, and transcatheter aortic valve replacement. We discussed treatment options in the context of the patient's specific comorbid medical conditions.   I have reviewed the TAVR procedure in detail with the patient and his wife including heart models, valve models  and a video of the TAVR procedure.   Repeat echo in 6 months.   I will see him back in the office after his echo.     Labs/ tests ordered today include:   Orders Placed This Encounter  Procedures   ECHOCARDIOGRAM COMPLETE   Disposition:   F/U  will be arranged with the structural team  Signed, Lonni Cash, MD, [facility] 01/06/2024 10:09 AM    [facility] [address] [place], [place], [place]  [address] Phone: [phone]; Fax: [phone]

## 2024-01-06 ENCOUNTER — Ambulatory Visit: Attending: Cardiovascular Disease | Admitting: Cardiovascular Disease

## 2024-01-06 ENCOUNTER — Encounter: Payer: Self-pay | Admitting: Cardiovascular Disease

## 2024-01-06 VITALS — BP 110/66 | HR 77 | Ht 69.0 in | Wt 190.0 lb

## 2024-01-06 DIAGNOSIS — I350 Nonrheumatic aortic (valve) stenosis: Secondary | ICD-10-CM | POA: Diagnosis not present

## 2024-01-06 NOTE — Patient Instructions (Signed)
 Medication Instructions:  Your physician recommends that you continue on your current medications as directed. Please refer to the Current Medication list given to you today.  *If you need a refill on your cardiac medications before your next appointment, please call your pharmacy*  Lab Work: none If you have labs (blood work) drawn today and your tests are completely normal, you will receive your results only by: MyChart Message (if you have MyChart) OR A paper copy in the mail If you have any lab test that is abnormal or we need to change your treatment, we will call you to review the results.  Testing/Procedures: Your physician has requested that you have an echocardiogram in June 2026. Echocardiography is a painless test that uses sound waves to create images of your heart. It provides your doctor with information about the size and shape of your heart and how well your hearts chambers and valves are working. This procedure takes approximately one hour. There are no restrictions for this procedure. Please do NOT wear cologne, perfume, aftershave, or lotions (deodorant is allowed). Please arrive 15 minutes prior to your appointment time.  Please note: We ask at that you not bring children with you during ultrasound (echo/ vascular) testing. Due to room size and safety concerns, children are not allowed in the ultrasound rooms during exams. Our front office staff cannot provide observation of children in our lobby area while testing is being conducted. An adult accompanying a patient to their appointment will only be allowed in the ultrasound room at the discretion of the ultrasound technician under special circumstances. We apologize for any inconvenience.\   Follow-Up: At [facility], you and your health needs are our priority.  As part of our continuing mission to provide you with exceptional heart care, our providers are all part of one team.  This team includes your primary  Cardiologist (physician) and Advanced Practice Providers or APPs (Physician Assistants and Nurse Practitioners) who all work together to provide you with the care you need, when you need it.  Your next appointment:   June 2026 (after echocardiogram)  Provider:   Lonni Cash, MD    We recommend signing up for the patient portal called MyChart.  Sign up information is provided on this After Visit Summary.  MyChart is used to connect with patients for Virtual Visits (Telemedicine).  Patients are able to view lab/test results, encounter notes, upcoming appointments, etc.  Non-urgent messages can be sent to your provider as well.   To learn more about what you can do with MyChart, go to [email or web address].   Other Instructions

## 2024-01-08 LAB — CULTURE, BLOOD (ROUTINE X 2)
Culture: NO GROWTH
Culture: NO GROWTH
Special Requests: ADEQUATE
Special Requests: ADEQUATE

## 2024-05-30 ENCOUNTER — Ambulatory Visit: Admitting: Cardiovascular Disease

## 2024-06-29 ENCOUNTER — Ambulatory Visit (HOSPITAL_COMMUNITY)

## 2024-07-06 ENCOUNTER — Ambulatory Visit: Admitting: Cardiovascular Disease

## 2024-07-06 ENCOUNTER — Ambulatory Visit (HOSPITAL_COMMUNITY)
